# Patient Record
Sex: Female | Born: 1960 | Race: White | Hispanic: No | Marital: Married | Smoking: Never smoker
Health system: Southern US, Community
[De-identification: ages and names within clinical notes are randomized; demographics above are authoritative.]

## PROBLEM LIST (undated history)

## (undated) DIAGNOSIS — G40909 Epilepsy, unspecified, not intractable, without status epilepticus: Secondary | ICD-10-CM

## (undated) DIAGNOSIS — N83209 Unspecified ovarian cyst, unspecified side: Secondary | ICD-10-CM

## (undated) DIAGNOSIS — M8580 Other specified disorders of bone density and structure, unspecified site: Secondary | ICD-10-CM

## (undated) DIAGNOSIS — E042 Nontoxic multinodular goiter: Secondary | ICD-10-CM

## (undated) DIAGNOSIS — E785 Hyperlipidemia, unspecified: Secondary | ICD-10-CM

## (undated) HISTORY — DX: Epilepsy, unspecified, not intractable, without status epilepticus: G40.909

## (undated) HISTORY — DX: Hyperlipidemia, unspecified: E78.5

## (undated) HISTORY — DX: Unspecified ovarian cyst, unspecified side: N83.209

## (undated) HISTORY — DX: Nontoxic multinodular goiter: E04.2

## (undated) HISTORY — PX: ABDOMINAL SURGERY: SHX537

## (undated) HISTORY — DX: Other specified disorders of bone density and structure, unspecified site: M85.80

## (undated) HISTORY — PX: OVARIAN CYST SURGERY: SHX726

## (undated) HISTORY — PX: OTHER SURGICAL HISTORY: SHX169

---

## 1998-12-31 ENCOUNTER — Other Ambulatory Visit: Admission: RE | Admit: 1998-12-31 | Discharge: 1998-12-31 | Payer: Self-pay | Admitting: Obstetrics and Gynecology

## 2000-01-28 ENCOUNTER — Other Ambulatory Visit: Admission: RE | Admit: 2000-01-28 | Discharge: 2000-01-28 | Payer: Self-pay | Admitting: Obstetrics and Gynecology

## 2001-03-02 ENCOUNTER — Other Ambulatory Visit: Admission: RE | Admit: 2001-03-02 | Discharge: 2001-03-02 | Payer: Self-pay | Admitting: Obstetrics and Gynecology

## 2002-01-04 ENCOUNTER — Encounter: Admission: RE | Admit: 2002-01-04 | Discharge: 2002-01-04 | Payer: Self-pay | Admitting: Neurology

## 2002-01-04 ENCOUNTER — Encounter: Payer: Self-pay | Admitting: Neurology

## 2002-03-05 ENCOUNTER — Other Ambulatory Visit: Admission: RE | Admit: 2002-03-05 | Discharge: 2002-03-05 | Payer: Self-pay | Admitting: Obstetrics and Gynecology

## 2003-03-07 ENCOUNTER — Other Ambulatory Visit: Admission: RE | Admit: 2003-03-07 | Discharge: 2003-03-07 | Payer: Self-pay | Admitting: Obstetrics and Gynecology

## 2004-03-16 ENCOUNTER — Other Ambulatory Visit: Admission: RE | Admit: 2004-03-16 | Discharge: 2004-03-16 | Payer: Self-pay | Admitting: Obstetrics and Gynecology

## 2004-05-04 ENCOUNTER — Ambulatory Visit: Payer: Self-pay | Admitting: Internal Medicine

## 2005-03-11 ENCOUNTER — Ambulatory Visit: Payer: Self-pay | Admitting: Internal Medicine

## 2005-04-01 ENCOUNTER — Other Ambulatory Visit: Admission: RE | Admit: 2005-04-01 | Discharge: 2005-04-01 | Payer: Self-pay | Admitting: Obstetrics and Gynecology

## 2005-09-15 ENCOUNTER — Ambulatory Visit: Payer: Self-pay | Admitting: Internal Medicine

## 2006-02-23 ENCOUNTER — Ambulatory Visit: Payer: Self-pay | Admitting: Internal Medicine

## 2006-02-23 LAB — CONVERTED CEMR LAB
ALT: 37 units/L (ref 0–40)
AST: 25 units/L (ref 0–37)
Albumin: 3.9 g/dL (ref 3.5–5.2)
Alkaline Phosphatase: 88 units/L (ref 39–117)
BUN: 14 mg/dL (ref 6–23)
Basophils Absolute: 0 10*3/uL (ref 0.0–0.1)
Basophils Relative: 0.9 % (ref 0.0–1.0)
CO2: 32 meq/L (ref 19–32)
Calcium: 9.5 mg/dL (ref 8.4–10.5)
Chloride: 99 meq/L (ref 96–112)
Cholesterol: 254 mg/dL (ref 0–200)
Creatinine, Ser: 0.9 mg/dL (ref 0.4–1.2)
Direct LDL: 156.3 mg/dL
Eosinophils Absolute: 0.3 10*3/uL (ref 0.0–0.6)
Eosinophils Relative: 6.8 % — ABNORMAL HIGH (ref 0.0–5.0)
GFR calc Af Amer: 86 mL/min
GFR calc non Af Amer: 71 mL/min
Glucose, Bld: 77 mg/dL (ref 70–99)
HCT: 42.8 % (ref 36.0–46.0)
HDL: 61.4 mg/dL (ref >39.0)
Hemoglobin: 14.7 g/dL (ref 12.0–15.0)
Lymphocytes Relative: 38 % (ref 12.0–46.0)
MCHC: 34.2 g/dL (ref 30.0–36.0)
MCV: 94.6 fL (ref 78.0–100.0)
Monocytes Absolute: 0.3 10*3/uL (ref 0.2–0.7)
Monocytes Relative: 5.4 % (ref 3.0–11.0)
Neutro Abs: 2.3 10*3/uL (ref 1.4–7.7)
Neutrophils Relative %: 48.9 % (ref 43.0–77.0)
Phenytoin Lvl: 10.9 ug/mL (ref 10.0–20.0)
Platelets: 213 10*3/uL (ref 150–400)
Potassium: 4.4 meq/L (ref 3.5–5.1)
RBC: 4.52 M/uL (ref 3.87–5.11)
RDW: 12.4 % (ref 11.5–14.6)
Sodium: 139 meq/L (ref 135–145)
TSH: 1.53 microintl units/mL (ref 0.35–5.50)
Total Bilirubin: 0.8 mg/dL (ref 0.3–1.2)
Total CHOL/HDL Ratio: 4.1
Total Protein: 7.1 g/dL (ref 6.0–8.3)
Triglycerides: 189 mg/dL — ABNORMAL HIGH (ref 0–149)
VLDL: 38 mg/dL (ref 0–40)
WBC: 4.7 10*3/uL (ref 4.5–10.5)

## 2006-04-07 ENCOUNTER — Other Ambulatory Visit: Admission: RE | Admit: 2006-04-07 | Discharge: 2006-04-07 | Payer: Self-pay | Admitting: Obstetrics and Gynecology

## 2007-04-25 ENCOUNTER — Other Ambulatory Visit: Admission: RE | Admit: 2007-04-25 | Discharge: 2007-04-25 | Payer: Self-pay | Admitting: Obstetrics and Gynecology

## 2007-11-13 ENCOUNTER — Encounter: Payer: Self-pay | Admitting: Internal Medicine

## 2008-05-01 ENCOUNTER — Other Ambulatory Visit: Admission: RE | Admit: 2008-05-01 | Discharge: 2008-05-01 | Payer: Self-pay | Admitting: Obstetrics and Gynecology

## 2008-05-01 ENCOUNTER — Ambulatory Visit: Payer: Self-pay | Admitting: Obstetrics and Gynecology

## 2008-05-01 ENCOUNTER — Encounter: Payer: Self-pay | Admitting: Obstetrics and Gynecology

## 2008-06-07 ENCOUNTER — Ambulatory Visit: Payer: Self-pay | Admitting: Internal Medicine

## 2008-06-07 DIAGNOSIS — G40909 Epilepsy, unspecified, not intractable, without status epilepticus: Secondary | ICD-10-CM

## 2008-06-07 DIAGNOSIS — J309 Allergic rhinitis, unspecified: Secondary | ICD-10-CM | POA: Insufficient documentation

## 2008-09-04 ENCOUNTER — Ambulatory Visit: Payer: Self-pay | Admitting: Obstetrics and Gynecology

## 2009-05-11 ENCOUNTER — Other Ambulatory Visit: Admission: RE | Admit: 2009-05-11 | Discharge: 2009-05-11 | Payer: Self-pay | Admitting: Obstetrics and Gynecology

## 2009-05-11 ENCOUNTER — Ambulatory Visit: Payer: Self-pay | Admitting: Obstetrics and Gynecology

## 2009-05-29 ENCOUNTER — Encounter: Payer: Self-pay | Admitting: Internal Medicine

## 2009-08-11 ENCOUNTER — Encounter (INDEPENDENT_AMBULATORY_CARE_PROVIDER_SITE_OTHER): Payer: Self-pay | Admitting: *Deleted

## 2009-09-01 ENCOUNTER — Ambulatory Visit: Payer: Self-pay | Admitting: Internal Medicine

## 2009-09-01 DIAGNOSIS — E7800 Pure hypercholesterolemia, unspecified: Secondary | ICD-10-CM | POA: Insufficient documentation

## 2009-09-01 DIAGNOSIS — E785 Hyperlipidemia, unspecified: Secondary | ICD-10-CM | POA: Insufficient documentation

## 2009-09-01 LAB — CONVERTED CEMR LAB
Cholesterol, target level: 200 mg/dL
HDL goal, serum: 40 mg/dL
LDL Goal: 160 mg/dL

## 2009-09-03 ENCOUNTER — Encounter: Payer: Self-pay | Admitting: Internal Medicine

## 2009-09-07 ENCOUNTER — Encounter: Payer: Self-pay | Admitting: Internal Medicine

## 2009-09-07 LAB — CONVERTED CEMR LAB
ALT: 21 units/L (ref 0–35)
AST: 22 units/L (ref 0–37)
Albumin: 4.4 g/dL (ref 3.5–5.2)
Alkaline Phosphatase: 85 units/L (ref 39–117)
BUN: 15 mg/dL (ref 6–23)
Basophils Absolute: 0 10*3/uL (ref 0.0–0.1)
Basophils Relative: 0.5 % (ref 0.0–3.0)
Bilirubin, Direct: 0.1 mg/dL (ref 0.0–0.3)
CO2: 30 meq/L (ref 19–32)
Calcium: 10 mg/dL (ref 8.4–10.5)
Chloride: 108 meq/L (ref 96–112)
Cholesterol: 327 mg/dL — ABNORMAL HIGH (ref 0–200)
Creatinine, Ser: 1 mg/dL (ref 0.4–1.2)
Direct LDL: 212.2 mg/dL
Eosinophils Absolute: 0.2 10*3/uL (ref 0.0–0.7)
Eosinophils Relative: 4.2 % (ref 0.0–5.0)
GFR calc non Af Amer: 60.76 mL/min (ref >60)
Glucose, Bld: 86 mg/dL (ref 70–99)
HCT: 42.2 % (ref 36.0–46.0)
HDL: 63.6 mg/dL (ref >39.00)
Hemoglobin: 14.3 g/dL (ref 12.0–15.0)
Lymphocytes Relative: 39.8 % (ref 12.0–46.0)
Lymphs Abs: 2.2 10*3/uL (ref 0.7–4.0)
MCHC: 34 g/dL (ref 30.0–36.0)
MCV: 94.1 fL (ref 78.0–100.0)
Monocytes Absolute: 0.3 10*3/uL (ref 0.1–1.0)
Monocytes Relative: 5.5 % (ref 3.0–12.0)
Neutro Abs: 2.8 10*3/uL (ref 1.4–7.7)
Neutrophils Relative %: 50 % (ref 43.0–77.0)
Platelets: 200 10*3/uL (ref 150.0–400.0)
Potassium: 4.2 meq/L (ref 3.5–5.1)
RBC: 4.48 M/uL (ref 3.87–5.11)
RDW: 12.7 % (ref 11.5–14.6)
Sodium: 144 meq/L (ref 135–145)
TSH: 0.98 microintl units/mL (ref 0.35–5.50)
Total Bilirubin: 0.8 mg/dL (ref 0.3–1.2)
Total CHOL/HDL Ratio: 5
Total Protein: 7.6 g/dL (ref 6.0–8.3)
Triglycerides: 233 mg/dL — ABNORMAL HIGH (ref 0.0–149.0)
VLDL: 46.6 mg/dL — ABNORMAL HIGH (ref 0.0–40.0)
WBC: 5.6 10*3/uL (ref 4.5–10.5)

## 2009-12-11 ENCOUNTER — Encounter: Payer: Self-pay | Admitting: Internal Medicine

## 2010-01-31 HISTORY — PX: COLONOSCOPY: SHX174

## 2010-03-02 NOTE — Consult Note (Signed)
Summary: [facility]  [facility]   Imported By: Lanelle Bal 06/03/2009 10:18:41  _____________________________________________________________________  External Attachment:    Type:   Image     Comment:   External Document

## 2010-03-02 NOTE — Consult Note (Signed)
Summary: [facility]  [facility]   Imported By: Lanelle Bal 12/23/2009 14:13:40  _____________________________________________________________________  External Attachment:    Type:   Image     Comment:   External Document

## 2010-03-02 NOTE — Letter (Signed)
Summary: Letter with Path Results/[facility]  Letter with Path Results/[facility]   Imported By: Lanelle Bal 09/14/2009 12:26:06  _____________________________________________________________________  External Attachment:    Type:   Image     Comment:   External Document

## 2010-03-02 NOTE — Letter (Signed)
Summary: Previsit letter  [facility]  [address] [place], [place] [address]   Phone: [phone]  Fax: [phone]       08/11/2009 MRN: [id number]  Danielle Gamble [address] [place], [place]  [address]  Dear Ms. Dunkley,  Welcome to the Gastroenterology Division at [facility].    You are scheduled to see a nurse for your pre-procedure visit on 09-02-09 at 9:00a.m. on the 3rd floor at [facility], [address]  We ask that you try to arrive at our office 15 minutes prior to your appointment time to allow for check-in.  Your nurse visit will consist of discussing your medical and surgical history, your immediate family medical history, and your medications.    Please bring a complete list of all your medications or, if you prefer, bring the medication bottles and we will list them.  We will need to be aware of both prescribed and over the counter drugs.  We will need to know exact dosage information as well.  If you are on blood thinners (Coumadin, Plavix, Aggrenox, Ticlid, etc.) please call our office today/prior to your appointment, as we need to consult with your physician about holding your medication.   Please be prepared to read and sign documents such as consent forms, a financial agreement, and acknowledgement forms.  If necessary, and with your consent, a friend or relative is welcome to sit-in on the nurse visit with you.  Please bring your insurance card so that we may make a copy of it.  If your insurance requires a referral to see a specialist, please bring your referral form from your primary care physician.  No co-pay is required for this nurse visit.     If you cannot keep your appointment, please call [phone] to cancel or reschedule prior to your appointment date.  This allows [place] the opportunity to schedule an appointment for another patient in need of care.    Thank you for choosing [facility] for your medical needs.   We appreciate the opportunity to care for you.  Please visit [place] at our website  to learn more about our practice.                     Sincerely.                                                                                                                   The Gastroenterology Division

## 2010-03-02 NOTE — Letter (Signed)
Summary: Letter with Colonoscopy Results/[facility]  Letter with Colonoscopy Results/[facility]   Imported By: Lanelle Bal 09/15/2009 08:38:41  _____________________________________________________________________  External Attachment:    Type:   Image     Comment:   External Document

## 2010-03-02 NOTE — Assessment & Plan Note (Signed)
Summary: cpx & lab/cbs   Vital Signs:  Patient profile:   50 year old female Height:      69.5 inches Weight:      176.8 pounds BMI:     25.83 Temp:     98.7 degrees F oral Pulse rate:   76 / minute Resp:     14 per minute BP sitting:   102 / 60  (left arm) Cuff size:   large  Vitals Entered By: Shonna Chock CMA (September 01, 2009 9:58 AM)  CC: CPX with fasting labs , General Medical Evaluation, Lipid Management   CC:  CPX with fasting labs , General Medical Evaluation, and Lipid Management.  History of Present Illness: Danielle Gamble is here for a physical; she is asymptomatic.   Lipid Management History:      Negative NCEP/ATP III risk factors include female age less than 49 years old, no history of early menopause without estrogen hormone replacement, non-diabetic, HDL cholesterol greater than 60, no family history for ischemic heart disease, non-tobacco-user status, non-hypertensive, no ASHD (atherosclerotic heart disease), no prior stroke/TIA, no peripheral vascular disease, and no history of aortic aneurysm.     Preventive Screening-Counseling & Management  Alcohol-Tobacco     Smoking Status: never  Caffeine-Diet-Exercise     Does Patient Exercise: no  Current Medications (verified): 1)  Multivitamins  Tabs (Multiple Vitamin) .Marland Kitchen.. 1 By Mouth Once Daily 2)  Fish Oil 3000mg  .... 1 By Mouth Once Daily 3)  Calcium 1200mg  .... 1 By Mouth Once Daily 4)  Vit D .... 1 By Mouth Once Daily 5)  Aspirin 81 Mg Tabs (Aspirin) .Marland Kitchen.. 1 By Mouth Once Daily 6)  Lamictal Xr 50 Mg Xr24h-Tab (Lamotrigine) .Marland Kitchen.. 1 By Mouth Am , 1/2 By Mouth Pm  Allergies (verified): No Known Drug Allergies  Past History:  Past Medical History: Seizure disorder,  Lamtrigine  Rx, Dr Sandria Manly (previously  on Dilantin  1997-2010) Allergic rhinitis Hyperlipidemia: NMR 2007: LDL 181( 2231/1406), HDL 67, TG 122.LDL goal=< 110. [place] LDL goal = < 160.  Past Surgical History: G 0 P 0, Dr Eda Paschal; Ovarian  cystectomy X 2; Sternal cystectomy  Family History: Father: prostate cancer, MI in 53s, CBAG, CVA , pacer Mother: Alsheimer's  Siblings: negative P uncle :CAD  Social History: Occupation: Runner, broadcasting/film/video Married Never Smoked Alcohol use-yes: occasionally Regular exercise-no Does Patient Exercise:  no  Review of Systems  The patient denies anorexia, fever, vision loss, decreased hearing, hoarseness, chest pain, syncope, dyspnea on exertion, peripheral edema, prolonged cough, headaches, hemoptysis, abdominal pain, melena, hematochezia, severe indigestion/heartburn, hematuria, incontinence, suspicious skin lesions, depression, unusual weight change, abnormal bleeding, enlarged lymph nodes, and angioedema.         Weight up 10#; no specific diet.  Physical Exam  General:  well-nourished alert,appropriate and cooperative throughout examination Head:  Normocephalic and atraumatic without obvious abnormalities. Eyes:  No corneal or conjunctival inflammation noted.Perrla. Funduscopic exam benign, without hemorrhages, exudates or papilledema.  Ears:  External ear exam shows no significant lesions or deformities.  Otoscopic examination reveals clear canals, tympanic membranes are intact bilaterally without bulging, retraction, inflammation or discharge. Hearing is grossly normal bilaterally. Nose:  External nasal examination shows no deformity or inflammation. Nasal mucosa are pink and moist without lesions or exudates. Mouth:  Oral mucosa and oropharynx without lesions or exudates.  Teeth in good repair. Neck:  No deformities, masses, or tenderness noted. L thyroid > R w/o nodules Lungs:  Normal respiratory effort, chest expands symmetrically. Lungs are  clear to auscultation, no crackles or wheezes. Heart:  Normal rate and regular rhythm. S1 and S2 normal without gallop, murmur, click, rub . S4 Abdomen:  Bowel sounds positive,abdomen soft and non-tender without masses, organomegaly or hernias  noted. Genitalia:  Dr Eda Paschal Msk:  No deformity or scoliosis noted of thoracic or lumbar spine.   Pulses:  R and L carotid,radial,dorsalis pedis and posterior tibial pulses are full and equal bilaterally Extremities:  No clubbing, cyanosis, edema,   normal/ full range of motion of all joints.  Minor DIP OA changes Neurologic:  alert & oriented X3 and DTRs symmetrical and normal.   Skin:  Intact without suspicious lesions or rashes Cervical Nodes:  No lymphadenopathy noted Axillary Nodes:  No palpable lymphadenopathy Psych:  memory intact for recent and remote, normally interactive, and good eye contact.     Impression & Recommendations:  Problem # 1:  ROUTINE GENERAL MEDICAL EXAM@[email or web address]  CARE FACL (ICD-V70.0)  Orders: EKG w/ Interpretation ([address]) Venipuncture ([address]) TLB-Lipid Panel (80061-LIPID) TLB-BMP (Basic Metabolic Panel-BMET) (80048-METABOL) TLB-CBC Platelet - w/Differential (85025-CBCD) TLB-Hepatic/Liver Function Pnl (80076-HEPATIC) TLB-TSH (Thyroid Stimulating Hormone) (84443-TSH)  Problem # 2:  HYPERLIPIDEMIA (ICD-272.4) Minimal LDL goal = < 160, preferably < 110  Problem # 3:  SEIZURE DISORDER (ICD-780.39) as per Dr love The following medications were removed from the medication list:    Dilantin 100 Mg Caps (Phenytoin sodium extended) ..... 400mg  daily Her updated medication list for this problem includes:    Lamictal Xr 50 Mg Xr24h-tab (Lamotrigine) .Marland Kitchen... 1 by mouth am , 1/2 by mouth pm  Complete Medication List: 1)  Multivitamins Tabs (Multiple vitamin) .Marland Kitchen.. 1 by mouth once daily 2)  Fish Oil 3000mg   .... 1 by mouth once daily 3)  Calcium 1200mg   .... 1 by mouth once daily 4)  Vit D  .... 1 by mouth once daily 5)  Aspirin 81 Mg Tabs (Aspirin) .Marland Kitchen.. 1 by mouth once daily 6)  Lamictal Xr 50 Mg Xr24h-tab (Lamotrigine) .Marland Kitchen.. 1 by mouth am , 1/2 by mouth pm  Other Orders: Tdap => 71yrs IM (60454) Admin 1st Vaccine (09811)  Lipid Assessment/Plan:      Based on  NCEP/ATP III, the patient's risk factor category is "0-1 risk factors".  The patient's lipid goals are as follows: Total cholesterol goal is 200; LDL cholesterol goal is 160; HDL cholesterol goal is 40; Triglyceride goal is 150.    Patient Instructions: 1)  Review Dr Gildardo Griffes book Eat, Drink & Be Healthy for dietary cholesterol information.It is important that you exercise regularly at least 20 minutes 5 times a week. If you develop chest pain, have severe difficulty breathing, or feel very tired , stop exercising immediately and seek medical attention.Take an  81 mg coated Aspirin every day.   Immunizations Administered:  Tetanus Vaccine:    Vaccine Type: Tdap    Site: right deltoid    Mfr: GlaxoSmithKline    Dose: 0.5 ml    Route: IM    Given by: Shonna Chock CMA    Exp. Date: 04/25/2011    Lot #: [id number]    VIS given: 12/19/06 version given September 01, 2009.   Appended Document: cpx & lab/cbs

## 2010-05-13 ENCOUNTER — Other Ambulatory Visit: Payer: Self-pay | Admitting: Obstetrics and Gynecology

## 2010-05-13 ENCOUNTER — Encounter (INDEPENDENT_AMBULATORY_CARE_PROVIDER_SITE_OTHER): Payer: BC Managed Care – PPO | Admitting: Obstetrics and Gynecology

## 2010-05-13 ENCOUNTER — Other Ambulatory Visit (HOSPITAL_COMMUNITY)
Admission: RE | Admit: 2010-05-13 | Discharge: 2010-05-13 | Disposition: A | Discharge status: Home / Self Care | Payer: BC Managed Care – PPO | Source: Ambulatory Visit | Attending: Obstetrics and Gynecology | Admitting: Obstetrics and Gynecology

## 2010-05-13 DIAGNOSIS — Z01419 Encounter for gynecological examination (general) (routine) without abnormal findings: Secondary | ICD-10-CM

## 2010-05-13 DIAGNOSIS — R82998 Other abnormal findings in urine: Secondary | ICD-10-CM

## 2010-05-13 DIAGNOSIS — Z124 Encounter for screening for malignant neoplasm of cervix: Secondary | ICD-10-CM | POA: Insufficient documentation

## 2010-08-30 ENCOUNTER — Encounter: Payer: Self-pay | Admitting: Obstetrics and Gynecology

## 2010-09-10 ENCOUNTER — Encounter: Payer: Self-pay | Admitting: Internal Medicine

## 2010-09-10 ENCOUNTER — Ambulatory Visit (INDEPENDENT_AMBULATORY_CARE_PROVIDER_SITE_OTHER): Payer: BC Managed Care – PPO | Admitting: Internal Medicine

## 2010-09-10 VITALS — BP 124/80 | HR 87 | Temp 98.7°F | Resp 12 | Ht 69.5 in | Wt 180.8 lb

## 2010-09-10 DIAGNOSIS — M8580 Other specified disorders of bone density and structure, unspecified site: Secondary | ICD-10-CM | POA: Insufficient documentation

## 2010-09-10 DIAGNOSIS — T887XXA Unspecified adverse effect of drug or medicament, initial encounter: Secondary | ICD-10-CM

## 2010-09-10 DIAGNOSIS — E785 Hyperlipidemia, unspecified: Secondary | ICD-10-CM

## 2010-09-10 DIAGNOSIS — M949 Disorder of cartilage, unspecified: Secondary | ICD-10-CM

## 2010-09-10 DIAGNOSIS — M899 Disorder of bone, unspecified: Secondary | ICD-10-CM

## 2010-09-10 DIAGNOSIS — Z0000 Encounter for general adult medical examination without abnormal findings: Secondary | ICD-10-CM

## 2010-09-10 DIAGNOSIS — R569 Unspecified convulsions: Secondary | ICD-10-CM

## 2010-09-10 LAB — CBC WITH DIFFERENTIAL/PLATELET
Basophils Relative: 0.3 % (ref 0.0–3.0)
Eosinophils Relative: 3.6 % (ref 0.0–5.0)
HCT: 44.4 % (ref 36.0–46.0)
Hemoglobin: 14.9 g/dL (ref 12.0–15.0)
Lymphs Abs: 2.1 10*3/uL (ref 0.7–4.0)
Monocytes Relative: 5.2 % (ref 3.0–12.0)
Neutro Abs: 3.5 10*3/uL (ref 1.4–7.7)
RBC: 4.69 Mil/uL (ref 3.87–5.11)
WBC: 6.2 10*3/uL (ref 4.5–10.5)

## 2010-09-10 LAB — HEPATIC FUNCTION PANEL
ALT: 18 U/L (ref 0–35)
AST: 19 U/L (ref 0–37)
Bilirubin, Direct: 0 mg/dL (ref 0.0–0.3)
Total Bilirubin: 0.6 mg/dL (ref 0.3–1.2)

## 2010-09-10 LAB — BASIC METABOLIC PANEL
GFR: 72.71 mL/min (ref >60.00)
Glucose, Bld: 94 mg/dL (ref 70–99)
Potassium: 4.4 mEq/L (ref 3.5–5.1)
Sodium: 143 mEq/L (ref 135–145)

## 2010-09-10 LAB — LIPID PANEL
HDL: 66.3 mg/dL (ref >39.00)
Total CHOL/HDL Ratio: 4
VLDL: 33 mg/dL (ref 0.0–40.0)

## 2010-09-10 NOTE — Progress Notes (Signed)
  Subjective:    Patient ID: Danielle Gamble, female    DOB: December 11, 1960, 50 y.o.   MRN: [id number]  HPI  Danielle Gamble  is here for a physical; she has no acute issues.    Review of Systems Patient reports no vision/ hearing  changes, adenopathy,fever, weight change,  persistant / recurrent hoarseness , swallowing issues, chest pain,palpitations,edema,persistant /recurrent cough, hemoptysis, dyspnea( rest/ exertional/paroxysmal nocturnal), gastrointestinal bleeding(melena, rectal bleeding), abdominal pain, significant heartburn,  bowel changes,GU symptoms(dysuria, hematuria,pyuria, incontinence ), Gyn symptoms(abnormal  bleeding , pain),  syncope, focal weakness, memory loss,numbness & tingling, skin/hair /nail changes,abnormal bruising or bleeding, anxiety,or depression.      Objective:   Physical Exam Gen.: Healthy and well-nourished in appearance. Alert, appropriate and cooperative throughout exam. Head: Normocephalic without obvious abnormalities  Eyes: No corneal or conjunctival inflammation noted. Pupils equal round reactive to light and accommodation. Fundal exam is benign without hemorrhages, exudate, papilledema. Extraocular motion intact. Vision grossly normal  with lenses Ears: External  ear exam reveals no significant lesions or deformities. Canals clear .TMs normal. Hearing is grossly normal bilaterally. Nose: External nasal exam reveals no deformity or inflammation. Nasal mucosa are pink and moist. No lesions or exudates noted. Septum normal  Mouth: Oral mucosa and oropharynx reveal no lesions or exudates. Teeth in good repair. Neck: No deformities, masses, or tenderness noted. Range of motion normal. Thyroid :R lobe slightly small w/o nodules. Lungs: Normal respiratory effort; chest expands symmetrically. Lungs are clear to auscultation without rales, wheezes, or increased work of breathing. Heart: Normal rate and rhythm. Normal S1 and S2. No gallop, click, or rub. S4 w/o   murmur. Abdomen: Bowel sounds normal; abdomen soft and nontender. No masses, organomegaly or hernias noted. Genitalia: Dr Eda Paschal   .                                                                                   Musculoskeletal/extremities: No deformity or scoliosis noted of  the thoracic or lumbar spine. No clubbing, cyanosis, edema  noted. Range of motion  normal .Tone & strength  Normal.Joints:mild DIP  DJD changes. Nail health  good. Vascular: Carotid, radial artery, dorsalis pedis and  posterior tibial pulses are full and equal. No bruits present. Neurologic: Alert and oriented x3. Deep tendon reflexes symmetrical and normal.          Skin: Intact without suspicious lesions or rashes. Lymph: No cervical, axillary  lymphadenopathy present. Psych: Mood and affect are normal. Normally interactive                                                                                         Assessment & Plan:  #1 comprehensive physical exam; no acute findings #2 see Problem List with Assessments & Recommendations Plan: see Orders

## 2010-09-10 NOTE — Patient Instructions (Signed)
Preventive Health Care: Exercise  30-45  minutes a day, 3-4 days a week. Walking is especially valuable in preventing Osteoporosis. Eat a low-fat diet with lots of fruits and vegetables, up to 7-9 servings per day. Avoid obesity; your goal = waist less than 35 inches.Consume less than 30 grams of sugar per day from foods & drinks with High Fructose Corn Syrup as # 1,2,3 or #4 on label.

## 2010-09-16 ENCOUNTER — Encounter: Payer: Self-pay | Admitting: Internal Medicine

## 2010-09-30 ENCOUNTER — Encounter: Payer: Self-pay | Admitting: Internal Medicine

## 2011-05-09 ENCOUNTER — Encounter: Payer: Self-pay | Admitting: Gynecology

## 2011-05-09 DIAGNOSIS — N83209 Unspecified ovarian cyst, unspecified side: Secondary | ICD-10-CM | POA: Insufficient documentation

## 2011-05-17 ENCOUNTER — Encounter: Payer: BC Managed Care – PPO | Admitting: Obstetrics and Gynecology

## 2011-06-09 ENCOUNTER — Encounter: Payer: BC Managed Care – PPO | Admitting: Obstetrics and Gynecology

## 2011-06-21 ENCOUNTER — Encounter: Payer: BC Managed Care – PPO | Admitting: Obstetrics and Gynecology

## 2011-06-28 ENCOUNTER — Encounter: Payer: Self-pay | Admitting: Obstetrics and Gynecology

## 2011-06-28 ENCOUNTER — Ambulatory Visit (INDEPENDENT_AMBULATORY_CARE_PROVIDER_SITE_OTHER): Payer: BC Managed Care – PPO | Admitting: Obstetrics and Gynecology

## 2011-06-28 VITALS — BP 120/64 | Ht 69.0 in | Wt 182.0 lb

## 2011-06-28 DIAGNOSIS — Z01419 Encounter for gynecological examination (general) (routine) without abnormal findings: Secondary | ICD-10-CM

## 2011-06-28 NOTE — Progress Notes (Signed)
Patient came to see me today for her annual GYN exam. She is doing well without HRT. She is having no vaginal bleeding. She is having no pelvic pain. She will do her mammogram in July. Last July her bone density showed stable osteopenia without an elevated FRAX risk. She takes calcium and vitamin D. She's had no fractures. She does her lab through PCP. She has never had an abnormal Pap smear.  Physical examination:Danielle Gamble present. HEENT within normal limits. Neck: Thyroid not large. No masses. Supraclavicular nodes: not enlarged. Breasts: Examined in both sitting and lying  position. No skin changes and no masses. Abdomen: Soft no guarding rebound or masses or hernia. Pelvic: External: Within normal limits. BUS: Within normal limits. Vaginal:within normal limits. Good estrogen effect. No evidence of cystocele rectocele or enterocele. Cervix: clean. Uterus: Normal size and shape. Adnexa: No masses. Rectovaginal exam: Confirmatory and negative. Extremities: Within normal limits.  Assessment: Normal GYN exam. Stable osteopenia.  Plan: Continue yearly mammograms. Continue periodic bone densities.

## 2011-06-29 LAB — URINALYSIS W MICROSCOPIC + REFLEX CULTURE
Bilirubin Urine: NEGATIVE
Crystals: NONE SEEN
Glucose, UA: NEGATIVE mg/dL
Specific Gravity, Urine: 1.012 (ref 1.005–1.030)
Squamous Epithelial / LPF: NONE SEEN

## 2011-09-08 ENCOUNTER — Encounter: Payer: Self-pay | Admitting: Obstetrics and Gynecology

## 2011-11-15 ENCOUNTER — Ambulatory Visit (INDEPENDENT_AMBULATORY_CARE_PROVIDER_SITE_OTHER): Payer: BC Managed Care – PPO | Admitting: Internal Medicine

## 2011-11-15 ENCOUNTER — Encounter: Payer: Self-pay | Admitting: Internal Medicine

## 2011-11-15 ENCOUNTER — Ambulatory Visit
Admission: RE | Admit: 2011-11-15 | Discharge: 2011-11-15 | Disposition: A | Discharge status: Home / Self Care | Payer: BC Managed Care – PPO | Source: Ambulatory Visit | Attending: Internal Medicine | Admitting: Internal Medicine

## 2011-11-15 VITALS — BP 128/84 | HR 81 | Temp 98.2°F | Resp 14 | Ht 69.5 in | Wt 176.4 lb

## 2011-11-15 DIAGNOSIS — T887XXA Unspecified adverse effect of drug or medicament, initial encounter: Secondary | ICD-10-CM

## 2011-11-15 DIAGNOSIS — M8580 Other specified disorders of bone density and structure, unspecified site: Secondary | ICD-10-CM

## 2011-11-15 DIAGNOSIS — E785 Hyperlipidemia, unspecified: Secondary | ICD-10-CM

## 2011-11-15 DIAGNOSIS — Z0000 Encounter for general adult medical examination without abnormal findings: Secondary | ICD-10-CM

## 2011-11-15 DIAGNOSIS — M949 Disorder of cartilage, unspecified: Secondary | ICD-10-CM

## 2011-11-15 LAB — HEPATIC FUNCTION PANEL
ALT: 24 U/L (ref 0–35)
AST: 22 U/L (ref 0–37)
Bilirubin, Direct: 0.1 mg/dL (ref 0.0–0.3)
Total Bilirubin: 0.7 mg/dL (ref 0.3–1.2)

## 2011-11-15 LAB — TSH: TSH: 1.24 u[IU]/mL (ref 0.35–5.50)

## 2011-11-15 LAB — BASIC METABOLIC PANEL
BUN: 16 mg/dL (ref 6–23)
Creatinine, Ser: 0.8 mg/dL (ref 0.4–1.2)
GFR: 79.73 mL/min (ref >60.00)

## 2011-11-15 LAB — LDL CHOLESTEROL, DIRECT: Direct LDL: 220.1 mg/dL

## 2011-11-15 LAB — CBC WITH DIFFERENTIAL/PLATELET
Basophils Absolute: 0 10*3/uL (ref 0.0–0.1)
Eosinophils Relative: 4 % (ref 0.0–5.0)
Monocytes Relative: 5.8 % (ref 3.0–12.0)
Neutrophils Relative %: 48.1 % (ref 43.0–77.0)
Platelets: 215 10*3/uL (ref 150.0–400.0)
RDW: 12.8 % (ref 11.5–14.6)
WBC: 5.3 10*3/uL (ref 4.5–10.5)

## 2011-11-15 LAB — LIPID PANEL
Cholesterol: 289 mg/dL — ABNORMAL HIGH (ref 0–200)
Total CHOL/HDL Ratio: 5
Triglycerides: 173 mg/dL — ABNORMAL HIGH (ref 0.0–149.0)
VLDL: 34.6 mg/dL (ref 0.0–40.0)

## 2011-11-15 NOTE — Patient Instructions (Addendum)
Preventive Health Care: Exercise  30-45  minutes a day, 3-4 days a week. Walking is especially valuable in preventing Osteoporosis. Eat a low-fat diet with lots of fruits and vegetables, up to 7-9 servings per day. Consume less than 30 grams of sugar per day from foods & drinks with High Fructose Corn Syrup as #1,2,3 or #4 on label. Eye Doctor - have an eye exam @ least annually. Please take enteric-coated aspirin 81 mg daily with breakfast.  If you activate My Chart; the results can be released to you as soon as they populate from the lab. If you choose not to use this program; the labs have to be reviewed, copied & mailed   causing a delay in getting the results to you.

## 2011-11-15 NOTE — Progress Notes (Signed)
  Subjective:    Patient ID: Danielle Gamble, female    DOB: 1961/01/05, 51 y.o.   MRN: [id number]  HPI  Mircale is here for a physical; she denies acute health  issues .      Review of Systems  She elected not to take a statin, preferring to institute therapeutic life changes to control lipids. She is on modified low carb diet; she is not on an exercise program.  She denies chest pain, palpitations, exertional dyspnea, paroxysmal nocturnal dyspnea, claudication, or edema.  Her father had his first heart attack at 30; subsequently he had bypass & a pacer.       Objective:   Physical Exam Gen.: Healthy and well-nourished in appearance. Alert, appropriate and cooperative throughout exam. Head: Normocephalic without obvious abnormalities  Eyes: No corneal or conjunctival inflammation noted. Pupils equal round reactive to light and accommodation. Fundal exam is benign without hemorrhages, exudate, papilledema. Extraocular motion intact. Vision grossly normal with lenses. Ears: External  ear exam reveals no significant lesions or deformities. Canals clear .TMs normal. Hearing is grossly normal bilaterally. Nose: External nasal exam reveals no deformity or inflammation. Nasal mucosa are pink and moist. No lesions or exudates noted.  Mouth: Oral mucosa and oropharynx reveal no lesions or exudates. Teeth in good repair. Neck: No deformities, masses, or tenderness noted. Range of motion normal. Thyroid : ? Small nodule on R. Lungs: Normal respiratory effort; chest expands symmetrically. Lungs are clear to auscultation without rales, wheezes, or increased work of breathing. Heart: Normal rate and rhythm. Normal S1 and S2. No gallop, click, or rub. No murmur. Abdomen: Bowel sounds normal; abdomen soft and nontender. No masses, organomegaly or hernias noted. Genitalia:Dr  Gottsegen Musculoskeletal/extremities: Slight lordosis noted of  the thoracic  spine. No clubbing, cyanosis, edema noted. Range of  motion  normal .Tone & strength  Normal.Joints:mild DIP changes. Nail health  good. Vascular: Carotid, radial artery, dorsalis pedis and  posterior tibial pulses are full and equal. No bruits present. Neurologic: Alert and oriented x3. Deep tendon reflexes symmetrical and normal.          Skin: Intact without suspicious lesions or rashes. Lymph: No cervical, axillary lymphadenopathy present. Psych: Mood and affect are normal. Normally interactive                                                                                         Assessment & Plan:  #1 comprehensive physical exam #2 ? Thyroid nodule Plan: see Orders

## 2011-11-15 NOTE — Addendum Note (Signed)
Addended by: Silvio Pate D on: 11/15/2011 10:56 AM   Modules accepted: Orders

## 2011-11-16 ENCOUNTER — Telehealth: Payer: Self-pay

## 2011-11-16 NOTE — Telephone Encounter (Signed)
Pt called left message on triage line stating wanted to know [place] results. Called pt back stated to pt Dr. Sherren Mocha had a chance to result them yet will call when its done. Pt stated understanding.    MW

## 2011-11-17 ENCOUNTER — Telehealth: Payer: Self-pay

## 2011-11-17 ENCOUNTER — Encounter: Payer: Self-pay | Admitting: *Deleted

## 2011-11-17 ENCOUNTER — Telehealth: Payer: Self-pay | Admitting: Internal Medicine

## 2011-11-17 NOTE — Telephone Encounter (Signed)
Please advise 

## 2011-11-17 NOTE — Telephone Encounter (Signed)
Pt called concerning [place] results advised pt of the results:  Notes Recorded by Pecola Lawless, MD on 11/17/2011 at 7:41 AM Any "dominant" ( size > 1 cm ) thyroid nodule is usually biopsied under Xray guidance with a very fine needle. The alternative approach would be monitoring with Ultrasound & full thyroid function tests in 4-6 months or at least every 12 months. Please go to Web MD to review "Thyroid Nodule". After reviewing that site; please see me to discuss risk and options. Hopp    and let her know they had been mailed. Pt stated understanding.   MW

## 2011-11-17 NOTE — Telephone Encounter (Signed)
Caller: Mariacristina/Patient; Patient Name: Danielle Gamble; PCP: Marga Melnick; Best Callback Phone Number: [phone] Seen in office on Tuesday 11/15/2011 and sent for Thyroid UltraSound.  Got message from office to review Thyroid Nodule on Web MD and has done so.  Asking now if she needs to make appointment for Thyroid Biopsy.  EPIC says: Please go to Web MD to review "Thyroid Nodule". After reviewing that site; please see me to discuss risk and options.  Transferred to office for appointment wtih Dr Alwyn Ren to discuss information.

## 2011-11-17 NOTE — Telephone Encounter (Signed)
error 

## 2011-11-18 NOTE — Telephone Encounter (Signed)
The patient has a follow up on 10/29.     KP

## 2011-11-18 NOTE — Telephone Encounter (Signed)
rec to discuss w/ PCP next week, let pt know

## 2011-11-29 ENCOUNTER — Ambulatory Visit (INDEPENDENT_AMBULATORY_CARE_PROVIDER_SITE_OTHER): Payer: BC Managed Care – PPO | Admitting: Internal Medicine

## 2011-11-29 ENCOUNTER — Encounter: Payer: Self-pay | Admitting: Internal Medicine

## 2011-11-29 VITALS — BP 102/68 | HR 89 | Temp 98.6°F | Wt 178.4 lb

## 2011-11-29 DIAGNOSIS — E042 Nontoxic multinodular goiter: Secondary | ICD-10-CM

## 2011-11-29 NOTE — Progress Notes (Signed)
  Subjective:    Patient ID: Danielle Gamble, female    DOB: 03-27-1960, 52 y.o.   MRN: [id number]  HPI  Thyroid ultrasound report was reviewed; she has 3 nodules in the right lower lobe, the largest is 1.3 cm. The other 2 nodules are 1.1 and 1.0 cm in size. The dominant nodule does contain microcalcifications. There's also a 1.0 cm nodule in left lower lobe.  Her TSH has ranged from 0.98 to 1.53. The most recent value is 1.24.  There is no personal or family history of thyroid disease. There is no past history of facial or neck irradiation   Review of Systems She does have a history of ovarian cyst and fibroid of the sternum; she questioned whether these might be related to the thyroid nodules.  She had been experiencing some sensation of fullness in her neck in the submandibular areas     Objective:   Physical Exam   She appears healthy and well-nourished  Extraocular motion is intact without lid lag or proptosis  She has no lymphadenopathy about the neck or axilla. Specifically I feel no abnormalities in the carotid body area or submandibular area.  There is suggestion of a subtle nodule on the right on palpation the thyroid  There is no tremor or onycholysis present  Deep tendon reflexes are equal and normal         Assessment & Plan:

## 2011-11-29 NOTE — Assessment & Plan Note (Signed)
Risk and options discussed; she has elected to pursue the fine needle aspiration

## 2011-11-29 NOTE — Patient Instructions (Addendum)
Review and correct the record as indicated. Please share record with all medical staff seen.  

## 2011-12-08 ENCOUNTER — Other Ambulatory Visit (HOSPITAL_COMMUNITY)
Admission: RE | Admit: 2011-12-08 | Discharge: 2011-12-08 | Disposition: A | Discharge status: Home / Self Care | Payer: BC Managed Care – PPO | Source: Ambulatory Visit | Attending: Interventional Radiology | Admitting: Interventional Radiology

## 2011-12-08 ENCOUNTER — Ambulatory Visit
Admission: RE | Admit: 2011-12-08 | Discharge: 2011-12-08 | Disposition: A | Discharge status: Home / Self Care | Payer: BC Managed Care – PPO | Source: Ambulatory Visit | Attending: Internal Medicine | Admitting: Internal Medicine

## 2011-12-08 DIAGNOSIS — E042 Nontoxic multinodular goiter: Secondary | ICD-10-CM

## 2011-12-08 DIAGNOSIS — E049 Nontoxic goiter, unspecified: Secondary | ICD-10-CM | POA: Insufficient documentation

## 2011-12-15 ENCOUNTER — Telehealth: Payer: Self-pay

## 2011-12-15 NOTE — Telephone Encounter (Signed)
Message left on Triage voicemail: patient would like Thyroid Biopsy results  Hopp please advise

## 2011-12-16 ENCOUNTER — Telehealth: Payer: Self-pay

## 2011-12-16 ENCOUNTER — Other Ambulatory Visit: Payer: Self-pay | Admitting: Internal Medicine

## 2011-12-16 DIAGNOSIS — E041 Nontoxic single thyroid nodule: Secondary | ICD-10-CM

## 2011-12-16 NOTE — Telephone Encounter (Signed)
Pt called [facility] triage line concerning thyroid test results stating she received them in the mail and now is requesting an endo referral. Pt states she has seen Casimiro Needle Altheimer [address] In the past. And requesting to see him again. Pt asked should she call him and make appt or do we? Plz advise pt [phone] or [phone] MW

## 2011-12-16 NOTE — Telephone Encounter (Signed)
Patient called in indicating that she got results in the mail

## 2011-12-16 NOTE — Telephone Encounter (Signed)
Hopp please advise  

## 2011-12-20 NOTE — Telephone Encounter (Signed)
Spoke with patient, patient informed that referral placed, we are awaiting appointment. Patient states that if we do not hear with in the next few days she would like to see someone else for she is anxious to see someone

## 2011-12-20 NOTE — Telephone Encounter (Signed)
Pt [facility] stating third time calling concerning nodule on thyroid requesting to see endo MD Al[facility] need referral. Plz advise pt MW

## 2011-12-21 ENCOUNTER — Telehealth: Payer: Self-pay | Admitting: Internal Medicine

## 2011-12-21 DIAGNOSIS — M8580 Other specified disorders of bone density and structure, unspecified site: Secondary | ICD-10-CM

## 2011-12-21 NOTE — Telephone Encounter (Signed)
In reference to patient's call inquiring about why she hasn't heard anything about her bone density, I just looked, and there is no bone density order in epic.  Please enter.

## 2011-12-21 NOTE — Telephone Encounter (Signed)
Noted  

## 2011-12-21 NOTE — Telephone Encounter (Signed)
Erroneous phone note, patient not requesting BMD

## 2011-12-21 NOTE — Telephone Encounter (Signed)
On 12/16/11 the required referral form was completed, and along with it were faxed patient's clinicals, labs, tests, to A[facility]imer's office.  This is their process, once reviewed, they contact the patient to schedule her for an appointment.  I will call to check status of referral today and inform patient.

## 2011-12-26 ENCOUNTER — Ambulatory Visit: Payer: BC Managed Care – PPO | Admitting: Internal Medicine

## 2011-12-28 ENCOUNTER — Ambulatory Visit (INDEPENDENT_AMBULATORY_CARE_PROVIDER_SITE_OTHER): Payer: BC Managed Care – PPO | Admitting: Internal Medicine

## 2011-12-28 ENCOUNTER — Encounter: Payer: Self-pay | Admitting: Internal Medicine

## 2011-12-28 VITALS — BP 110/88 | HR 90 | Temp 98.2°F | Resp 16 | Wt 177.0 lb

## 2011-12-28 DIAGNOSIS — Z23 Encounter for immunization: Secondary | ICD-10-CM

## 2011-12-28 DIAGNOSIS — E042 Nontoxic multinodular goiter: Secondary | ICD-10-CM

## 2011-12-28 NOTE — Progress Notes (Signed)
Subjective:     Patient ID: Danielle Gamble, female   DOB: 24-Apr-1960, 51 y.o.   MRN: [id number]  HPI Danielle Gamble is a very pleasant 51 y/o WW referred by her PCP, Dr. Marga Melnick, for MNG, especially for discussion of the recent pathology report for the biopsy of her dominant thyroid nodule. She is here today with her husband.  Pt. described a nodule that she felt in her neck at her last Annual Physical Exam with Dr. Alwyn Ren. He also felt a right nodule, therefore, she was sent for a thyroid U/S, on 11/15/2011, that showed 4 nodules:  Bilateral thyroid nodules. The largest on the right is a 1.3 cm right lower pole nodule which is solid and contains microcalcifications. Adjacent 11 mm and 10 mm lower pole nodules which are solid. 10 mm solid nodule in the lower pole of the left thyroid lobe. Scattered subcentimeteric thyroid nodules. Lymphadenopathy: None visualized.  IMPRESSION:  Bilateral thyroid nodules as above. There is a 13 mm nodule in the lower pole of the right thyroid lobe containing microcalcifications. Given the microcalcifications, may consider tissue sampling of this nodule versus continued close interval follow-up.  She was then sent for FNA of the dominant right nodule, however, the sample was not considered adequate due to too little epithelium, obscuring blood and crush artifact. The tissue that was seen was considered benign.   Adequacy Reason Satisfactory But Limited For Evaluation Due to Scant Follicular Epithelium, Obscuring Blood and Crush Artifact. Diagnosis THYROID, FINE NEEDLE ASPIRATION, RIGHT: Scant follicular epithelium admixed with blood and scant colloid. Comment: in areas where the cells are well preserved and well visualized they appear benign and a hyperplastic nodule is favored. However, the obscuring blood, crush artifact and scant amount of follicular epithelium is limiting. Please correlate with clinical/radiologic impression. Dr. Luisa Hart has seen this case in  consultation with essential agreement. Danielle Abts MD Pathologist, [employer]  Pt is anxious about the result and is mainly here to discuss about it..   She denies h/o head/neck radiation or FH of thyroid cancer. Her last TSH was normal, 1.24 (11/15/2011). She still has some fullness in her neck, mainly in the right side, but no dysphagia.   Past Medical History  Diagnosis Date  . Seizure disorder     Dr Sandria Manly  . Allergic rhinitis     seasonal  . Hyperlipidemia   . Osteopenia 2003    T score -1.1 spine   . Ovarian cyst     PMH of  . Seizures     Past Surgical History  Procedure Date  . Ovarian cyst surgery     done in [place]  . Sternal cystectomy   . Colonoscopy 2012    negative, F/U in 2022([facility])  . Abdominal surgery     Expl. Laparotomy-Right ovar.cyst    History   Social History  . Marital Status: Married    Spouse Name: N/A    Number of Children: N/A  . Years of Education: N/A   Occupational History  . Not on file.   Social History Main Topics  . Smoking status: Never Smoker   . Smokeless tobacco: Not on file  . Alcohol Use: 0.5 oz/week    1 drink(s) per week     Comment: socially on weekends  . Drug Use: No  . Sexually Active: Yes    Birth Control/ Protection: Other-see comments     Comment: VASECTOMY   Other Topics Concern  . Not on file  Social History Narrative  . No narrative on file    Medication Sig  . aspirin 81 MG tablet Take 81 mg by mouth daily.    . Calcium Carbonate-Vit D-Min (CALCIUM 1200 PO) Take by mouth daily.    . Cholecalciferol (VITAMIN D3) 1000 UNITS CAPS Take by mouth daily.    . Garlic 1500 MG CAPS Take by mouth daily.  Marland Kitchen lamoTRIgine (LAMICTAL) 100 MG tablet Take 100 mg by mouth. 1 by mouth in the am, 1/2 by mouth in the evening   . Multiple Vitamin (MULTIVITAMIN) tablet Take 1 tablet by mouth daily.    . Omega-3 Fatty Acids (FISH OIL PO) Take 1,200 mg by mouth 2 (two) times daily.    No  Known Allergies  Family History  Problem Relation Age of Onset  . Prostate cancer Father   . Heart attack Father 68    CBAG; pacer  . Stroke Father 37  . Alzheimer's disease Mother   . Coronary artery disease Paternal Uncle   . Diabetes Neg Hx     Review of Systems Constitutional: no weight gain/loss, no fatigue, no subjective hyperthermia/hypothermia Eyes: no blurry vision, no xerophthalmia ENT: no sore throat, no nodules palpated in throat, no dysphagia/odynophagia, no hoarseness Cardiovascular: no CP/SOB/palpitations/leg swelling Respiratory: no cough/SOB Gastrointestinal: no N/V/D/C Musculoskeletal: no muscle/joint aches Skin: no rashes Neurological: no tremors/numbness/tingling/dizziness Psychiatric: no depression/anxiety     Objective:   Physical Exam BP 110/88  Pulse 90  Temp 98.2 F (36.8 C) (Oral)  Resp 16  Wt 177 lb (80.287 kg)  SpO2 96%    Wt Readings from Last 3 Encounters:  12/28/11 177 lb (80.287 kg)  11/29/11 178 lb 6.4 oz (80.922 kg)  11/15/11 176 lb 6.4 oz (80.015 kg)   Constitutional: in NAD Eyes: PERRLA, EOMI, no exophthalmos ENT: moist mucous membranes, no thyromegaly, nodule palpated in right antero-lateral neck, no cervical lymphadenopathy Cardiovascular: RRR, No MRG Respiratory: CTA B Gastrointestinal: abdomen soft, NT, ND, BS+ Musculoskeletal: no deformities, strength intact in all 4 Skin: moist, warm, no rashes Neurological: no tremor with outstretched hands, DTR normal in all 4  Assessment:     1. MNG - R lobe: 1.3 cm nodule with microcalcifications >> FNA nondiagnostic/unsatisfactory  1.1 cm nodule  1.0 cm nodule - L lobe: 1.0 cm nodule    Plan:     - we discussed about the pathology reading of her FNA and about the Bethesda cytopathologic classification of thyroid nodules. I gave her the reference article for it: E.S. Cibas and S.Z. Karie Mainland "The Bethesda System for Reporting Thyroid Cytopathology" Am J Clin Pathol  2009;132:658-665 - I explained that the Nondiagnostic/Unsatisfactory category confers a 1-4% chance of cancer, for her, probably at the lower end since she does not have other risk factors - I advised her to just monitor for growth for now, and let me know if this happens, or if she starts feeling neck compression spx - otherwise, I will see her in 1 year and reevaluate - since she was very anxious about the nature of the nodules, I suggested that she had a repeat FNA in a year, not only of the dominant nodule, but of all 4 nodules. If this is negative for malignancy, we can then follow the goiter on a more relaxed basis, repeating the U/S probably after another 3 years. - pt agrees to plan >> I placed the order for the FNA to be done before our next visit in a year so we can discuss  the results. - no labs for today

## 2011-12-28 NOTE — Patient Instructions (Signed)
Please return in a year to review the results of your ultrasound.

## 2012-01-05 ENCOUNTER — Other Ambulatory Visit: Payer: Self-pay | Admitting: Internal Medicine

## 2012-01-05 DIAGNOSIS — E042 Nontoxic multinodular goiter: Secondary | ICD-10-CM

## 2012-01-05 NOTE — Progress Notes (Signed)
Discussed with IR, they would like to have a regular thyroid U/S order to be done first (before FNA of all 4 nodules) in a year from now. Will order this.

## 2012-01-09 ENCOUNTER — Telehealth: Payer: Self-pay

## 2012-01-09 MED ORDER — SIMVASTATIN 20 MG PO TABS: 20.0000 mg | ORAL_TABLET | Freq: Every day | ORAL | Status: DC

## 2012-01-09 NOTE — Telephone Encounter (Signed)
Message left on voicemail: Patient is ready to consider taking Cholesterol medication, please send rx to [facility] at [address] and call patient when sent in   Dr.Hopper please advise

## 2012-01-09 NOTE — Telephone Encounter (Signed)
Discuss with patient, Rx sent. 

## 2012-01-09 NOTE — Telephone Encounter (Signed)
Simvastatin 20 mg at bedtime dispense 90.Please  schedule fasting Labs : CK,Lipids, hepatic panel after 10 weeks ( 272.4,995.20)

## 2012-01-19 ENCOUNTER — Ambulatory Visit (INDEPENDENT_AMBULATORY_CARE_PROVIDER_SITE_OTHER): Payer: BC Managed Care – PPO

## 2012-01-19 DIAGNOSIS — Z0000 Encounter for general adult medical examination without abnormal findings: Secondary | ICD-10-CM

## 2012-01-19 DIAGNOSIS — Z2911 Encounter for prophylactic immunotherapy for respiratory syncytial virus (RSV): Secondary | ICD-10-CM

## 2012-04-13 ENCOUNTER — Other Ambulatory Visit: Payer: Self-pay | Admitting: Internal Medicine

## 2012-07-03 ENCOUNTER — Encounter: Payer: Self-pay | Admitting: Gynecology

## 2012-07-03 ENCOUNTER — Ambulatory Visit (INDEPENDENT_AMBULATORY_CARE_PROVIDER_SITE_OTHER): Payer: BC Managed Care – PPO | Admitting: Gynecology

## 2012-07-03 VITALS — BP 108/70 | Ht 68.25 in | Wt 179.0 lb

## 2012-07-03 DIAGNOSIS — M8580 Other specified disorders of bone density and structure, unspecified site: Secondary | ICD-10-CM

## 2012-07-03 DIAGNOSIS — Z01419 Encounter for gynecological examination (general) (routine) without abnormal findings: Secondary | ICD-10-CM

## 2012-07-03 DIAGNOSIS — N952 Postmenopausal atrophic vaginitis: Secondary | ICD-10-CM

## 2012-07-03 DIAGNOSIS — M899 Disorder of bone, unspecified: Secondary | ICD-10-CM

## 2012-07-03 DIAGNOSIS — M949 Disorder of cartilage, unspecified: Secondary | ICD-10-CM

## 2012-07-03 NOTE — Patient Instructions (Signed)
Followup with bone density and mammogram this coming summer. Followup in 1 year for annual exam.

## 2012-07-03 NOTE — Progress Notes (Signed)
Danielle Gamble [birth year] [id number]        52 y.o.  G0P0 for annual exam.  Former patient of Dr. Eda Paschal doing well without complaints.  Past medical history,surgical history, medications, allergies, family history and social history were all reviewed and documented in the EPIC chart.  ROS:  Performed and pertinent positives and negatives are included in the history, assessment and plan .  Exam: Biomedical scientist Filed Vitals:   07/03/12 1557  BP: 108/70  Height: 5' 8.25" (1.734 m)  Weight: 179 lb (81.194 kg)   General appearance  Normal Skin grossly normal Head/Neck normal with no cervical or supraclavicular adenopathy thyroid normal Lungs  clear Cardiac RR, without RMG Abdominal  soft, nontender, without masses, organomegaly or hernia Breasts  examined lying and sitting without masses, retractions, discharge or axillary adenopathy. Pelvic  Ext/BUS/vagina  normal with mild atrophic changes  Cervix  normal with mild atrophic changes  Uterus  anteverted, normal size, shape and contour, midline and mobile nontender   Adnexa  Without masses or tenderness    Anus and perineum  normal   Rectovaginal  normal sphincter tone without palpated masses or tenderness.    Assessment/Plan:  52 y.o. G0P0 female for annual exam.   1. Postmenopausal. Doing well without significant symptoms such as hot flashes night sweats vaginal dryness dyspareunia. Never took HRT. Plan expectant management at her choice. Patient knows to report any bleeding. 2. Osteopenia. DEXA 08/2010 with T score -1.7. FRAX 5.7%/0.6%. Increase calcium vitamin D reviewed. In checking vitamin D level next time Dr. Alwyn Ren does blood work. Recommend repeat DEXA this summer at a 2 year interval at [facility] where she has it done. 3. Mammography 09/2011. Continue with annual mammography. SBE monthly reviewed. 4. Pap smear 2012. No Pap smear done today. No history of abnormal Pap smears previously. Plan repeat next year at 3 year  interval. 5. Colonoscopy 2013. Repeat at their recommended interval. 6. Health maintenance. Patient sees Dr. Alwyn Ren routinely. The blood work done today since done through his office. Followup in one year, sooner as needed.    [name], 4:50 PM 07/03/2012

## 2012-07-04 ENCOUNTER — Encounter: Payer: Self-pay | Admitting: Obstetrics and Gynecology

## 2012-07-10 ENCOUNTER — Encounter: Payer: Self-pay | Admitting: Obstetrics and Gynecology

## 2012-09-10 ENCOUNTER — Encounter: Payer: Self-pay | Admitting: Internal Medicine

## 2012-09-10 ENCOUNTER — Ambulatory Visit (INDEPENDENT_AMBULATORY_CARE_PROVIDER_SITE_OTHER): Payer: BC Managed Care – PPO | Admitting: Internal Medicine

## 2012-09-10 ENCOUNTER — Ambulatory Visit: Payer: BC Managed Care – PPO | Admitting: Internal Medicine

## 2012-09-10 VITALS — BP 118/76 | HR 80 | Wt 176.0 lb

## 2012-09-10 DIAGNOSIS — B070 Plantar wart: Secondary | ICD-10-CM

## 2012-09-10 DIAGNOSIS — E042 Nontoxic multinodular goiter: Secondary | ICD-10-CM

## 2012-09-10 NOTE — Progress Notes (Signed)
  Subjective:    Patient ID: Danielle Gamble, female    DOB: 1960/07/15, 52 y.o.   MRN: [id number]  HPI   She's had a lesion over the ventral surface of her right foot for 6-8 months. She's noted intermittent redness along its lateral surface and also intermittent throbbing in this area when supine. It is more symptomatic on hard surfaces. She's noticed it is affected her stance; she tends to invert the foot.      Review of Systems  There has been no rash; she describes dryness of her feet and is tender to wear socks to increase moisture.  She denies any constitutional symptoms such as fever, chills, sweats, or weight loss.  No numbness & tingling in foot  She is seen a dermatologist on 2 occasions for the great toenail changes. She was told it is not a fungus infection based on scrapings. TSH & FBS WNL  S/P biopsies of neck "cysts" (thyroid biopsies ; monitored by Dr Wyonia Hough. That note was reviewed)     Objective:   Physical Exam  She is healthy and well-nourished in no distress  Slightly asymmetric thyroid with irregular texture  Pedal pulses are intact and equal.  She does have the IP degenerative joint changes in hands  She has onycholysis of the right great toenail  There is  a 2 x 2.5 cm thickened area over the right sole at the base of the toes. Within this are  there is a 5 x 3 mm warty growth @ 7 o'clock. There is no evidence of active cellulitis.  DTRs WNL          Assessment & Plan:  #1 plantar wart versus neuroma #2 onycholysis, R/O fungal infection #3 OA DIP changes #4 multinodular goiter  Plan: Podiatry consult Endocrine F/U 11/14

## 2012-09-10 NOTE — Patient Instructions (Addendum)
You can use the MyChart system to gain direct  access to your records  if  out of town or @ an office of a  physician who is not in  the My Chart network.  This improves continuity of care & places you in control of your medical record.

## 2012-09-11 ENCOUNTER — Encounter: Payer: Self-pay | Admitting: Gynecology

## 2012-09-12 ENCOUNTER — Ambulatory Visit (INDEPENDENT_AMBULATORY_CARE_PROVIDER_SITE_OTHER): Payer: BC Managed Care – PPO | Admitting: Podiatry

## 2012-09-12 DIAGNOSIS — L84 Corns and callosities: Secondary | ICD-10-CM | POA: Insufficient documentation

## 2012-09-12 DIAGNOSIS — M21969 Unspecified acquired deformity of unspecified lower leg: Secondary | ICD-10-CM

## 2012-09-12 DIAGNOSIS — B351 Tinea unguium: Secondary | ICD-10-CM

## 2012-09-12 DIAGNOSIS — R234 Changes in skin texture: Secondary | ICD-10-CM | POA: Insufficient documentation

## 2012-09-12 DIAGNOSIS — L988 Other specified disorders of the skin and subcutaneous tissue: Secondary | ICD-10-CM

## 2012-09-12 DIAGNOSIS — Q828 Other specified congenital malformations of skin: Secondary | ICD-10-CM

## 2012-09-12 MED ORDER — EFINACONAZOLE 10 % EX SOLN: 1.0000 | Freq: Every morning | CUTANEOUS | Status: DC

## 2012-09-12 NOTE — Progress Notes (Signed)
Subjective: 52 year old female presents complaining of painful callus under ball right that is more painful than others. Big toe nail fungus x 12-14 months. Also has painful cracking callus on both heels. Patient was referred by Dr. Alwyn Ren.  Review of Systems - General ROS: negative for - chills, fatigue, hot flashes, night sweats, sleep disturbance, weight gain or weight loss Psychological ROS: negative Ophthalmic ROS: Bifocal glasses. ENT ROS: negative Allergy and Immunology ROS: Seasonal hayfever. Hematological and Lymphatic ROS: negative Breast ROS: negative for breast lumps Respiratory ROS: no cough, shortness of breath, or wheezing Cardiovascular ROS: no chest pain or dyspnea on exertion Gastrointestinal ROS: no abdominal pain, change in bowel habits, or black or bloody stools Musculoskeletal ROS: negative Neurological ROS: no TIA or stroke symptoms Dermatological ROS: Dry scaly skin.  Objective: Vascular:  All pedal pulses are palpable. No edema or erythema noted. Dermatologic:  Mild yellow spotty discolored with mild thickness on both great toe nails. Thick plantar keratosis with round demarcation just proximal to the 3rd MPJ 3 right. Broad callus under 5th MPJ bilateral. Thick cracked callus both plantar posterior heel. Neurologic:  No subjective numbness or tingling sensations. All epicritic and tactile sensations grossly intact. Orthopedic:  Excess sagittal plane motion at the first MCJ and lateral weight shifting upon loading of forefoot bilateral.  Prominent 5th MPJ bilateral with plantar callus.  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 Plantar callus sub 5 bilateral. Elevated first ray bilateral. Mild case of Onychomycosis both great toe nails. Fissured heel calluses bilateral.   Plan:  Rx. Jublia for fungal nails. Both feet soaked and debrided all calluses, and fissured and cracked heel.  May benefit from Orthotic shoe inserts to accommodate sub 3 right  foot lesion. Return for palliation prn.

## 2012-10-13 ENCOUNTER — Other Ambulatory Visit: Payer: Self-pay | Admitting: Internal Medicine

## 2012-10-15 NOTE — Telephone Encounter (Signed)
Med filled #30 pt needs labs.

## 2012-11-05 ENCOUNTER — Other Ambulatory Visit: Payer: Self-pay | Admitting: *Deleted

## 2012-11-05 DIAGNOSIS — E785 Hyperlipidemia, unspecified: Secondary | ICD-10-CM

## 2012-11-07 ENCOUNTER — Other Ambulatory Visit: Payer: BC Managed Care – PPO

## 2012-11-08 ENCOUNTER — Other Ambulatory Visit: Payer: BC Managed Care – PPO

## 2012-11-13 ENCOUNTER — Other Ambulatory Visit (INDEPENDENT_AMBULATORY_CARE_PROVIDER_SITE_OTHER): Payer: BC Managed Care – PPO

## 2012-11-13 DIAGNOSIS — E785 Hyperlipidemia, unspecified: Secondary | ICD-10-CM

## 2012-11-14 LAB — HEPATIC FUNCTION PANEL
ALT: 25 U/L (ref 0–35)
AST: 28 U/L (ref 0–37)
Albumin: 4.4 g/dL (ref 3.5–5.2)
Alkaline Phosphatase: 87 U/L (ref 39–117)
Bilirubin, Direct: 0 mg/dL (ref 0.0–0.3)
Total Bilirubin: 0.4 mg/dL (ref 0.3–1.2)
Total Protein: 7.6 g/dL (ref 6.0–8.3)

## 2012-11-14 LAB — LDL CHOLESTEROL, DIRECT: Direct LDL: 111.9 mg/dL

## 2012-11-14 LAB — LIPID PANEL
Cholesterol: 210 mg/dL — ABNORMAL HIGH (ref 0–200)
Triglycerides: 325 mg/dL — ABNORMAL HIGH (ref 0.0–149.0)

## 2012-11-15 ENCOUNTER — Encounter: Payer: Self-pay | Admitting: *Deleted

## 2012-11-15 NOTE — Progress Notes (Signed)
Letter mailed to patient.

## 2012-11-19 ENCOUNTER — Telehealth: Payer: Self-pay | Admitting: Internal Medicine

## 2012-11-19 MED ORDER — SIMVASTATIN 20 MG PO TABS: ORAL_TABLET | ORAL | Status: DC

## 2012-11-19 NOTE — Telephone Encounter (Signed)
Simvastatin refilled #14, 0 refills

## 2012-11-19 NOTE — Telephone Encounter (Signed)
Patient called to refill her simvastatin (ZOCOR) 20 MG tablet temporary until her apt on October 28th @4pm      Pharmacy Pharmacy: [id number] - [place], [place] - [address]

## 2012-11-27 ENCOUNTER — Encounter: Payer: Self-pay | Admitting: Internal Medicine

## 2012-11-27 ENCOUNTER — Ambulatory Visit (INDEPENDENT_AMBULATORY_CARE_PROVIDER_SITE_OTHER): Payer: BC Managed Care – PPO | Admitting: Internal Medicine

## 2012-11-27 VITALS — BP 107/75 | HR 97 | Temp 99.6°F | Wt 175.2 lb

## 2012-11-27 DIAGNOSIS — Z23 Encounter for immunization: Secondary | ICD-10-CM

## 2012-11-27 DIAGNOSIS — E785 Hyperlipidemia, unspecified: Secondary | ICD-10-CM

## 2012-11-27 MED ORDER — SIMVASTATIN 20 MG PO TABS: ORAL_TABLET | ORAL | Status: DC

## 2012-11-27 NOTE — Progress Notes (Signed)
  Subjective:    Patient ID: Danielle Gamble, female    DOB: [birth year], 52 y.o.   MRN: [id number]  HPI  She returns for assessment of response to low-dose simvastatin. Her LDL has dropped from 220.1-111.9 on 20 mg of simvastatin. This represents almost 120% reduction in long-term risk.  Liver functions are perfectly normal  Unfortunately in the interim her triglycerides have risen from 173-325. These have been as low as 122.    Review of Systems A heart healthy diet is followed; exercise encompasses 30 minutes 3-4 times per week as swimming  without symptoms. Specifically denied are  chest pain, palpitations, dyspnea, or claudication.  Family history is negative  for premature coronary disease. Advanced cholesterol testing reveals  LDL goal is less than 110 ; ideally <80  . There is medication compliance with the statin. Significant abdominal symptoms, memory deficit, or myalgias denied.     Objective:   Physical Exam Appears healthy and well-nourished & in no acute distress  No carotid bruits are present.No neck pain distention present at 10 - 15 degrees. Thyroid irregular in contour & asymmetric to palpation  Heart rhythm and rate are normal with no significant murmurs or gallops. S4  Chest is clear with no increased work of breathing  There is no evidence of aortic aneurysm or renal artery bruits  Abdomen soft with no organomegaly or masses. No HJR  No clubbing, cyanosis or edema present.  Pedal pulses are intact   No ischemic skin changes are present . Nails healthy   Alert and oriented. Strength, tone, DTRs reflexes normal          Assessment & Plan:  See Current Assessment & Plan in Problem List under specific Diagnosis

## 2012-11-27 NOTE — Patient Instructions (Signed)
Please  schedule fasting Lipids in 12 months

## 2012-11-27 NOTE — Assessment & Plan Note (Signed)
Nutritional intervention stressed; no change in medication

## 2012-12-04 ENCOUNTER — Encounter: Payer: Self-pay | Admitting: Internal Medicine

## 2012-12-04 ENCOUNTER — Ambulatory Visit (INDEPENDENT_AMBULATORY_CARE_PROVIDER_SITE_OTHER): Payer: BC Managed Care – PPO | Admitting: Internal Medicine

## 2012-12-04 VITALS — BP 102/68 | HR 86 | Temp 98.9°F | Resp 10 | Wt 175.5 lb

## 2012-12-04 DIAGNOSIS — E042 Nontoxic multinodular goiter: Secondary | ICD-10-CM

## 2012-12-04 NOTE — Patient Instructions (Signed)
Please return in 1 year.  I will order the thyroid biopsy for all the 4 nodules. Please expect a call from [facility].

## 2012-12-04 NOTE — Progress Notes (Signed)
Subjective:     Patient ID: Danielle Gamble, female   DOB: 1960/10/05, 52 y.o.   MRN: [id number]  HPI Danielle Gamble is a pleasant 52 y.o. woman initially referred by Dr. Marga Melnick, for MNG, now returning for followup. Last visit a year ago. She is here today with her husband.  Reviewed history: Pt. Found a right thyroid nodule, later also palpated by PCP during her Annual Physical Exam in 2013. A thyroid U/S, on 11/15/2011 showed MNG, with bilateral subcm nodules, but also 4 larger nodules: largest = 1.3 cm right lower pole nodule, solid, containing microcalcifications. Adjacent 11 mm and 10 mm lower pole nodules, also solid. Also, there was a 10 mm solid nodule. She was then sent for FNA of the dominant right nodule, however, the sample was not considered adequate due to too little epithelium, obscuring blood and crush artifact. The tissue that was seen was considered benign. Patient was referred to me for further discussion since she was very anxious about the result. I suggested to wait another year and re-biopsy the nodule.  She returns now, a year after last visit. No neck compression sxs. No signs or sxs of hypo or hyperthyroidism.  Last TSH was normal: Lab Results  Component Value Date   TSH 1.24 11/15/2011   Review of Systems Constitutional: no weight gain/loss, no fatigue, no subjective hyperthermia/hypothermia Eyes: no blurry vision, no xerophthalmia ENT: no sore throat, no nodules palpated in throat, no dysphagia/odynophagia, no hoarseness Cardiovascular: no CP/SOB/palpitations/leg swelling Respiratory: no cough/SOB Gastrointestinal: no N/V/D/C Musculoskeletal: no muscle/joint aches Skin: no rashes Neurological: no tremors/numbness/tingling/dizziness Psychiatric: no depression/anxiety  I reviewed pt's medications, allergies, PMH, social hx, family hx and no changes required, except as mentioned above, and she started simvastatin 20 mg daily.  Objective:   Physical Exam BP  102/68  Pulse 86  Temp(Src) 98.9 F (37.2 C) (Oral)  Resp 10  Wt 175 lb 8 oz (79.606 kg)  SpO2 96%    Wt Readings from Last 3 Encounters:  12/04/12 175 lb 8 oz (79.606 kg)  11/27/12 175 lb 3.2 oz (79.47 kg)  09/10/12 176 lb (79.833 kg)   Constitutional: in NAD Eyes: PERRLA, EOMI, no exophthalmos ENT: moist mucous membranes, no thyromegaly, nodule palpated in right antero-lateral neck, no cervical lymphadenopathy Cardiovascular: RRR, No MRG Respiratory: CTA B Gastrointestinal: abdomen soft, NT, ND, BS+ Musculoskeletal: no deformities, strength intact in all 4 Skin: moist, warm, no rashes Neurological: no tremor with outstretched hands, DTR normal in all 4  Assessment:     1. MNG - R lobe: 1.3 cm nodule with microcalcifications >> FNA nondiagnostic/unsatisfactory  1.1 cm nodule  1.0 cm nodule - L lobe: 1.0 cm nodule  - FNA R 1.3 cm nodule: Adequacy Reason Satisfactory But Limited For Evaluation Due to Scant Follicular Epithelium, Obscuring Blood and Crush Artifact. Diagnosis THYROID, FINE NEEDLE ASPIRATION, RIGHT: Scant follicular epithelium admixed with blood and scant colloid. Comment: in areas where the cells are well preserved and well visualized they appear benign and a hyperplastic nodule is favored. However, the obscuring blood, crush artifact and scant amount of follicular epithelium is limiting.   Plan:     At last visit, we discussed about the pathology reading of her FNA and about the Bethesda cytopathologic classification of thyroid nodules. I explained that the Nondiagnostic/Unsatisfactory category confers a 1-4% chance of cancer, for her, probably at the lower end since she does not have other risk factors. I suggested that we met in 1 year and  reevaluate. - she has no new neck compression symptoms, or any indication that the nodules have grown in the last year - since she is very anxious about the nature of the nodules, I suggested that she had a repeat FNA in  a year, not only of the dominant nodule, but of all 4 nodules. If this is negative for malignancy, we can then follow the goiter on a more relaxed basis, repeating the U/S probably after another 3 years. - pt agrees to plan >> I placed the order for the FNA to be done in [facility]. - since her last set of thyroid labs were a year ago, I will repeat them today - I will see the patient back in a year; however, if biopsy is positive for malignancy, I would see her sooner.   12/11/2012: THYROID ULTRASOUND COMPARISON: 11/15/2011  FINDINGS: - Right thyroid lobe: 5.6 x 1.9 x 2.0 cm. Similar pattern of right mid to lower pole thyroid nodules. These nodules are unchanged in size or sonographic appearance. Dominant nodule in the right inferior thyroid gland measures 1.4 x 1.0 x 1.0 cm. All of the other right thyroid nodules measure 1 cm or less in size. No new or enlarging suspicious thyroid nodule. No lesion meets criteria for repeat biopsy. - Left thyroid lobe: 4.7 x 1.3 x 1.6 cm. Stable 9 mm left inferior pole solid thyroid nodule - Isthmus Thickness: 2 mm. No nodules visualized. - Lymphadenopathy Nonvisualized  IMPRESSION: Stable bilateral thyroid nodules.  Will see if pt agrees to wait another 2 years and repeat U/S and see from there. I called but was only able to leave a message.

## 2012-12-11 ENCOUNTER — Ambulatory Visit
Admission: RE | Admit: 2012-12-11 | Discharge: 2012-12-11 | Disposition: A | Discharge status: Home / Self Care | Payer: BC Managed Care – PPO | Source: Ambulatory Visit | Attending: Internal Medicine | Admitting: Internal Medicine

## 2012-12-11 ENCOUNTER — Ambulatory Visit: Admission: RE | Admit: 2012-12-11 | Payer: BC Managed Care – PPO | Source: Ambulatory Visit

## 2012-12-11 DIAGNOSIS — E042 Nontoxic multinodular goiter: Secondary | ICD-10-CM

## 2013-01-31 HISTORY — PX: BIOPSY THYROID: PRO38

## 2013-02-17 ENCOUNTER — Other Ambulatory Visit: Payer: Self-pay

## 2013-02-17 DIAGNOSIS — R569 Unspecified convulsions: Secondary | ICD-10-CM

## 2013-02-17 MED ORDER — LAMOTRIGINE 100 MG PO TABS: ORAL_TABLET | ORAL | Status: DC

## 2013-02-17 NOTE — Telephone Encounter (Signed)
Former [facility] patient assigned to Dr Marjory LiesPenumalli per Dr Imagene GurneyLove's nptes in [facility] .

## 2013-02-22 ENCOUNTER — Encounter: Payer: BC Managed Care – PPO | Admitting: Internal Medicine

## 2013-02-25 ENCOUNTER — Telehealth: Payer: Self-pay | Admitting: Neurology

## 2013-02-25 NOTE — Telephone Encounter (Signed)
It is OK to schedule her at 3:30pm, My latest slot.

## 2013-02-26 NOTE — Telephone Encounter (Signed)
Called to sched , lt VM message for appt

## 2013-03-28 ENCOUNTER — Other Ambulatory Visit: Payer: Self-pay

## 2013-03-28 DIAGNOSIS — R569 Unspecified convulsions: Secondary | ICD-10-CM

## 2013-03-28 MED ORDER — LAMOTRIGINE 100 MG PO TABS: ORAL_TABLET | ORAL | Status: DC

## 2013-04-16 ENCOUNTER — Encounter (INDEPENDENT_AMBULATORY_CARE_PROVIDER_SITE_OTHER): Payer: Self-pay

## 2013-04-16 ENCOUNTER — Encounter: Payer: Self-pay | Admitting: Neurology

## 2013-04-16 ENCOUNTER — Ambulatory Visit (INDEPENDENT_AMBULATORY_CARE_PROVIDER_SITE_OTHER): Payer: BC Managed Care – PPO | Admitting: Neurology

## 2013-04-16 VITALS — BP 127/82 | HR 94 | Ht 68.75 in | Wt 173.0 lb

## 2013-04-16 DIAGNOSIS — R569 Unspecified convulsions: Secondary | ICD-10-CM

## 2013-04-16 MED ORDER — LAMOTRIGINE 100 MG PO TABS: ORAL_TABLET | ORAL | Status: DC

## 2013-04-16 NOTE — Progress Notes (Signed)
PATIENT: Danielle LemonsRoberta R Gamble DOB: 1960/05/05  HISTORICAL  Danielle Gamble is a 53 years old right-handed Caucasian female, previously patient of Dr. 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 aspirin 81 MG tablet Take 81 mg by mouth daily.        . Calcium Carbonate-Vit D-Min (CALCIUM 1200 PO) Take by mouth daily.        . Cholecalciferol (VITAMIN D3) 1000 UNITS CAPS Take by mouth daily.        . Efinaconazole (JUBLIA) 10 % SOLN Apply 1 Bottle topically every morning.  1 Bottle  6  . Multiple Vitamin (MULTIVITAMIN) tablet Take 1 tablet by mouth daily.        . Omega-3 Fatty Acids (FISH OIL PO) Take 1,200 mg by mouth daily.       . simvastatin (ZOCOR) 20 MG tablet Take one tablet by mouth at bedtime  90 tablet  3   No current facility-administered medications on file prior to visit.    PAST MEDICAL HISTORY: Past Medical History  Diagnosis Date  . Seizure disorder     Dr Danielle Gamble  . Allergic rhinitis     seasonal  . Hyperlipidemia   . Osteopenia 08/2010    T score -1.7 FRAX 5.7%/0.6%  . Ovarian cyst     PMH of  . Seizures     PAST SURGICAL HISTORY: Past Surgical History  Procedure Laterality Date  .  Ovarian cyst surgery      [place] in [place]  . Sternal cystectomy    . Colonoscopy  2012    negative, F/U in 2022([facility])  . Abdominal surgery      Expl. Laparotomy-Right ovar.cyst    FAMILY HISTORY: Family History  Problem Relation Age of Onset  . Prostate cancer Father   . Heart attack Father [id number]    CBAG; pacer  . Stroke Father 1480  . Alzheimer's disease Mother   . Coronary artery disease Paternal Uncle   . Diabetes Neg Hx     SOCIAL HISTORY:  History   Social History  . Marital Status: Married    Spouse Name: Danielle Gamble    Number of Children: 0  . Years of Education: college   Occupational History  .  [employer]   Social History Main Topics  . Smoking status: Never Smoker   . Smokeless tobacco: Never Used  . Alcohol Use: 0.6 oz/week    1 Glasses of wine per week     Comment: socially on weekends  . Drug Use: No  . Sexual Activity: Yes    Birth Control/ Protection: Other-see comments     Comment: VASECTOMY   Other Topics Concern  . Not on file  Social History Narrative   Patient  Lives at home with her husband Danielle Noa) Danielle Gamble    Patient works full time at [employer]   Right handed   Caffeine two cups daily     PHYSICAL EXAM   Filed Vitals:   04/16/13 1522  BP: 127/82  Pulse: 94  Height: 5' 8.75" (1.746 m)  Weight: 173 lb (78.472 kg)    Not recorded    Body mass index is 25.74 kg/(m^2).   Generalized: In no acute distress  Neck: Supple, no carotid bruits   Cardiac: Regular rate rhythm  Pulmonary: Clear to auscultation bilaterally  Musculoskeletal: No deformity  Neurological examination  Mentation: Alert oriented to time, place, history taking, and causual conversation  Cranial nerve II-XII: Pupils were equal round reactive to light. Extraocular movements were full.  Visual field were full on confrontational test. Bilateral fundi were sharp.  Facial sensation and strength were normal. Hearing was  intact to finger rubbing bilaterally. Uvula tongue midline.  Head turning and shoulder shrug and were normal and symmetric.Tongue protrusion into cheek strength was normal.  Motor: Normal tone, bulk and strength.  Sensory: Intact to fine touch, pinprick, preserved vibratory sensation, and proprioception at toes.  Coordination: Normal finger to nose, heel-to-shin bilaterally there was no truncal ataxia  Gait: Rising up from seated position without assistance, normal stance, without trunk ataxia, moderate stride, good arm swing, smooth turning, able to perform tiptoe, and heel walking without difficulty.   Romberg signs: Negative  Deep tendon reflexes: Brachioradialis 2/2, biceps 2/2, triceps 2/2, patellar 2/2, Achilles 2/2, plantar responses were flexor bilaterally.   DIAGNOSTIC DATA (LABS, IMAGING, TESTING) - I reviewed patient records, labs, notes, testing and imaging myself where available.  Lab Results  Component Value Date   WBC 5.3 11/15/2011   HGB 14.8 11/15/2011   HCT 45.1 11/15/2011   MCV 94.8 11/15/2011   PLT 215.0 11/15/2011      Component Value Date/Time   NA 140 11/15/2011 1043   K 3.8 11/15/2011 1043   CL 103 11/15/2011 1043   CO2 27 11/15/2011 1043   GLUCOSE 87 11/15/2011 1043   BUN 16 11/15/2011 1043   CREATININE 0.8 11/15/2011 1043   CALCIUM 9.8 11/15/2011 1043   PROT 7.6 11/13/2012 1546   ALBUMIN 4.4 11/13/2012 1546   AST 28 11/13/2012 1546   ALT 25 11/13/2012 1546   ALKPHOS 87 11/13/2012 1546   BILITOT 0.4 11/13/2012 1546   GFRNONAA 60.76 09/01/2009 1034   GFRAA 86 02/23/2006 1044   Lab Results  Component Value Date   CHOL 210* 11/13/2012   HDL 58.30 11/13/2012   LDLDIRECT 111.9 11/13/2012   TRIG 325.0* 11/13/2012   CHOLHDL 4 11/13/2012      ASSESSMENT AND PLAN  Danielle Gamble is a 53 y.o. female with past medical history of recurrent seizure, in 1995, and in 1997, normal MRI of the brain, EEG,  Has been doing very well on current dose of  lamotrigine 100 mg half tablet in the morning, one tablet in the evening,  I have refilled her lamotrigine for one year, she can continue her refill by her primary care physician Dr. Marga Gamble, only return to clinic if new issues arise    Levert Feinstein, M.D. Ph.D.  [facility] [address] [place], [place] [address] [phone]

## 2013-08-06 ENCOUNTER — Encounter: Payer: BC Managed Care – PPO | Admitting: Gynecology

## 2013-08-31 DIAGNOSIS — M8580 Other specified disorders of bone density and structure, unspecified site: Secondary | ICD-10-CM

## 2013-08-31 HISTORY — DX: Other specified disorders of bone density and structure, unspecified site: M85.80

## 2013-09-05 ENCOUNTER — Encounter: Payer: Self-pay | Admitting: Gynecology

## 2013-09-05 ENCOUNTER — Other Ambulatory Visit (HOSPITAL_COMMUNITY)
Admission: RE | Admit: 2013-09-05 | Discharge: 2013-09-05 | Disposition: A | Discharge status: Home / Self Care | Payer: BC Managed Care – PPO | Source: Ambulatory Visit | Attending: Gynecology | Admitting: Gynecology

## 2013-09-05 ENCOUNTER — Ambulatory Visit (INDEPENDENT_AMBULATORY_CARE_PROVIDER_SITE_OTHER): Payer: BC Managed Care – PPO | Admitting: Gynecology

## 2013-09-05 VITALS — BP 112/66 | Ht 69.0 in | Wt 172.0 lb

## 2013-09-05 DIAGNOSIS — M899 Disorder of bone, unspecified: Secondary | ICD-10-CM

## 2013-09-05 DIAGNOSIS — M8580 Other specified disorders of bone density and structure, unspecified site: Secondary | ICD-10-CM

## 2013-09-05 DIAGNOSIS — Z1151 Encounter for screening for human papillomavirus (HPV): Secondary | ICD-10-CM | POA: Insufficient documentation

## 2013-09-05 DIAGNOSIS — Z01419 Encounter for gynecological examination (general) (routine) without abnormal findings: Secondary | ICD-10-CM

## 2013-09-05 DIAGNOSIS — M949 Disorder of cartilage, unspecified: Secondary | ICD-10-CM

## 2013-09-05 NOTE — Addendum Note (Signed)
Addended by: Dayna BarkerGARDNER, KIMBERLY K on: 09/05/2013 03:27 PM   Modules accepted: Orders

## 2013-09-05 NOTE — Patient Instructions (Signed)
Followup in one year, sooner as needed.  You may obtain a copy of any labs that were done today by logging onto MyChart as outlined in the instructions provided with your AVS (after visit summary). The office will not call with normal lab results but certainly if there are any significant abnormalities then we will contact you.   Health Maintenance, Female A healthy lifestyle and preventative care can promote health and wellness.  Maintain regular health, dental, and eye exams.  Eat a healthy diet. Foods like vegetables, fruits, whole grains, low-fat dairy products, and lean protein foods contain the nutrients you need without too many calories. Decrease your intake of foods high in solid fats, added sugars, and salt. Get information about a proper diet from your caregiver, if necessary.  Regular physical exercise is one of the most important things you can do for your health. Most adults should get at least 150 minutes of moderate-intensity exercise (any activity that increases your heart rate and causes you to sweat) each week. In addition, most adults need muscle-strengthening exercises on 2 or more days a week.   Maintain a healthy weight. The body mass index (BMI) is a screening tool to identify possible weight problems. It provides an estimate of body fat based on height and weight. Your caregiver can help determine your BMI, and can help you achieve or maintain a healthy weight. For adults 20 years and older:  A BMI below 18.5 is considered underweight.  A BMI of 18.5 to 24.9 is normal.  A BMI of 25 to 29.9 is considered overweight.  A BMI of 30 and above is considered obese.  Maintain normal blood lipids and cholesterol by exercising and minimizing your intake of saturated fat. Eat a balanced diet with plenty of fruits and vegetables. Blood tests for lipids and cholesterol should begin at age 41 and be repeated every 5 years. If your lipid or cholesterol levels are high, you are over  50, or you are a high risk for heart disease, you may need your cholesterol levels checked more frequently.Ongoing high lipid and cholesterol levels should be treated with medicines if diet and exercise are not effective.  If you smoke, find out from your caregiver how to quit. If you do not use tobacco, do not start.  Lung cancer screening is recommended for adults aged 49 80 years who are at high risk for developing lung cancer because of a history of smoking. Yearly low-dose computed tomography (CT) is recommended for people who have at least a 30-pack-year history of smoking and are a current smoker or have quit within the past 15 years. A pack year of smoking is smoking an average of 1 pack of cigarettes a day for 1 year (for example: 1 pack a day for 30 years or 2 packs a day for 15 years). Yearly screening should continue until the smoker has stopped smoking for at least 15 years. Yearly screening should also be stopped for people who develop a health problem that would prevent them from having lung cancer treatment.  If you are pregnant, do not drink alcohol. If you are breastfeeding, be very cautious about drinking alcohol. If you are not pregnant and choose to drink alcohol, do not exceed 1 drink per day. One drink is considered to be 12 ounces (355 mL) of beer, 5 ounces (148 mL) of wine, or 1.5 ounces (44 mL) of liquor.  Avoid use of street drugs. Do not share needles with anyone. Ask for help  if you need support or instructions about stopping the use of drugs.  High blood pressure causes heart disease and increases the risk of stroke. Blood pressure should be checked at least every 1 to 2 years. Ongoing high blood pressure should be treated with medicines, if weight loss and exercise are not effective.  If you are 55 to 55 years old, ask your caregiver if you should take aspirin to prevent strokes.  Diabetes screening involves taking a blood sample to check your fasting blood sugar level.  This should be done once every 3 years, after age 45, if you are within normal weight and without risk factors for diabetes. Testing should be considered at a younger age or be carried out more frequently if you are overweight and have at least 1 risk factor for diabetes.  Breast cancer screening is essential preventative care for women. You should practice "breast self-awareness." This means understanding the normal appearance and feel of your breasts and may include breast self-examination. Any changes detected, no matter how small, should be reported to a caregiver. Women in their 20s and 30s should have a clinical breast exam (CBE) by a caregiver as part of a regular health exam every 1 to 3 years. After age 40, women should have a CBE every year. Starting at age 40, women should consider having a mammogram (breast X-ray) every year. Women who have a family history of breast cancer should talk to their caregiver about genetic screening. Women at a high risk of breast cancer should talk to their caregiver about having an MRI and a mammogram every year.  Breast cancer gene (BRCA)-related cancer risk assessment is recommended for women who have family members with BRCA-related cancers. BRCA-related cancers include breast, ovarian, tubal, and peritoneal cancers. Having family members with these cancers may be associated with an increased risk for harmful changes (mutations) in the breast cancer genes BRCA1 and BRCA2. Results of the assessment will determine the need for genetic counseling and BRCA1 and BRCA2 testing.  The Pap test is a screening test for cervical cancer. Women should have a Pap test starting at age 21. Between ages 21 and 29, Pap tests should be repeated every 2 years. Beginning at age 30, you should have a Pap test every 3 years as long as the past 3 Pap tests have been normal. If you had a hysterectomy for a problem that was not cancer or a condition that could lead to cancer, then you no  longer need Pap tests. If you are between ages 65 and 70, and you have had normal Pap tests going back 10 years, you no longer need Pap tests. If you have had past treatment for cervical cancer or a condition that could lead to cancer, you need Pap tests and screening for cancer for at least 20 years after your treatment. If Pap tests have been discontinued, risk factors (such as a new sexual partner) need to be reassessed to determine if screening should be resumed. Some women have medical problems that increase the chance of getting cervical cancer. In these cases, your caregiver may recommend more frequent screening and Pap tests.  The human papillomavirus (HPV) test is an additional test that may be used for cervical cancer screening. The HPV test looks for the virus that can cause the cell changes on the cervix. The cells collected during the Pap test can be tested for HPV. The HPV test could be used to screen women aged 30 years and older, and   should be used in women of any age who have unclear Pap test results. After the age of 30, women should have HPV testing at the same frequency as a Pap test.  Colorectal cancer can be detected and often prevented. Most routine colorectal cancer screening begins at the age of 50 and continues through age 75. However, your caregiver may recommend screening at an earlier age if you have risk factors for colon cancer. On a yearly basis, your caregiver may provide home test kits to check for hidden blood in the stool. Use of a small camera at the end of a tube, to directly examine the colon (sigmoidoscopy or colonoscopy), can detect the earliest forms of colorectal cancer. Talk to your caregiver about this at age 50, when routine screening begins. Direct examination of the colon should be repeated every 5 to 10 years through age 75, unless early forms of pre-cancerous polyps or small growths are found.  Hepatitis C blood testing is recommended for all people born from  1945 through 1965 and any individual with known risks for hepatitis C.  Practice safe sex. Use condoms and avoid high-risk sexual practices to reduce the spread of sexually transmitted infections (STIs). Sexually active women aged 25 and younger should be checked for Chlamydia, which is a common sexually transmitted infection. Older women with new or multiple partners should also be tested for Chlamydia. Testing for other STIs is recommended if you are sexually active and at increased risk.  Osteoporosis is a disease in which the bones lose minerals and strength with aging. This can result in serious bone fractures. The risk of osteoporosis can be identified using a bone density scan. Women ages 65 and over and women at risk for fractures or osteoporosis should discuss screening with their caregivers. Ask your caregiver whether you should be taking a calcium supplement or vitamin D to reduce the rate of osteoporosis.  Menopause can be associated with physical symptoms and risks. Hormone replacement therapy is available to decrease symptoms and risks. You should talk to your caregiver about whether hormone replacement therapy is right for you.  Use sunscreen. Apply sunscreen liberally and repeatedly throughout the day. You should seek shade when your shadow is shorter than you. Protect yourself by wearing long sleeves, pants, a wide-brimmed hat, and sunglasses year round, whenever you are outdoors.  Notify your caregiver of new moles or changes in moles, especially if there is a change in shape or color. Also notify your caregiver if a mole is larger than the size of a pencil eraser.  Stay current with your immunizations. Document Released: 08/02/2010 Document Revised: 05/14/2012 Document Reviewed: 08/02/2010 Exit[employer] Patient Information 2014 [employer].   

## 2013-09-05 NOTE — Progress Notes (Signed)
Danielle LemonsRoberta R Beigel [birth year] [id number]        53 y.o.  G0P0 for annual exam.  Several issues noted below.  Past medical history,surgical history, problem list, medications, allergies, family history and social history were all reviewed and documented as reviewed in the EPIC chart.  ROS:  12 system ROS performed with pertinent positives and negatives included in the history, assessment and plan.   Additional significant findings :  None   Exam: [facility] person[facility]istant Filed Vitals:   09/05/13 1429  BP: 112/66  Height: 5\' 9"  (1.753 m)  Weight: 172 lb (78.019 kg)   General appearance:  Normal affect, orientation and appearance. Skin: Grossly normal HEENT: Without gross lesions.  No cervical or supraclavicular adenopathy. Thyroid normal.  Lungs:  Clear without wheezing, rales or rhonchi Cardiac: RR, without RMG Abdominal:  Soft, nontender, without masses, guarding, rebound, organomegaly or hernia Breasts:  Examined lying and sitting without masses, retractions, discharge or axillary adenopathy. Pelvic:  Ext/BUS/vagina with general atrophic changes  Cervix with atrophic changes. Pap/HPV  Uterus anteverted, normal size, shape and contour, midline and mobile nontender   Adnexa  Without masses or tenderness    Anus and perineum  Normal   Rectovaginal  Normal sphincter tone without palpated masses or tenderness.    Assessment/Plan:  53 y.o. G0P0 female for annual exam.   1. Postmenopausal/atrophic genital changes. Patient without significant symptoms of hot flushes, night sweats, vaginal dryness or dyspareunia. No vaginal bleeding. Continue to monitor. Report any vaginal bleeding. 2. Osteopenia. DEXA 08/2010 T score -1.7 FRAX 5.7%/0.6%. Repeat DEXA now. Increase calcium vitamin D reviewed. 3. Pap smear 2012. Pap/HPV today. No history of abnormal Pap smears previously. Plan repeat Pap smear in 3-5 year interval assuming this Pap smear is normal per current screening guidelines. 4. Mammography  scheduled next week. Continue with annual mammography. SBE monthly reviewed. 5. Colonoscopy 2010. Repeat at their recommended interval. 6. Health maintenance. No routine blood work done as this is done at her primary physician's office. Followup for DEXA, otherwise annually.   Note: This document was prepared with digital dictation and possible smart phrase technology. Any transcriptional errors that result from this process are unintentional.   Dara LordsFONTAINE,Demontrez Rindfleisch P MD, 3:13 PM 09/05/2013

## 2013-09-09 ENCOUNTER — Telehealth: Payer: Self-pay | Admitting: *Deleted

## 2013-09-09 LAB — CYTOLOGY - PAP

## 2013-09-09 NOTE — Telephone Encounter (Signed)
Message copied by Aura CampsWEBB, Deborra Phegley L on Mon Sep 09, 2013  8:38 AM ------      Message from: Dara LordsFONTAINE, TIMOTHY P      Created: Thu Sep 05, 2013  3:15 PM       [facility] ------

## 2013-09-09 NOTE — Telephone Encounter (Signed)
Appointment on 09/11/13 @ 1:15pm, pt aware and order faxed.

## 2013-09-12 ENCOUNTER — Encounter: Payer: Self-pay | Admitting: Gynecology

## 2013-09-16 ENCOUNTER — Encounter: Payer: Self-pay | Admitting: Gynecology

## 2013-09-17 ENCOUNTER — Telehealth: Payer: Self-pay | Admitting: Gynecology

## 2013-09-17 ENCOUNTER — Encounter: Payer: Self-pay | Admitting: Gynecology

## 2013-09-17 DIAGNOSIS — M898X9 Other specified disorders of bone, unspecified site: Secondary | ICD-10-CM

## 2013-09-17 DIAGNOSIS — M8580 Other specified disorders of bone density and structure, unspecified site: Secondary | ICD-10-CM

## 2013-09-17 NOTE — Telephone Encounter (Signed)
Tell patient her bone density shows a little bit of bone loss but overall appears stable. Would recommend checking a vitamin D level.

## 2013-09-17 NOTE — Telephone Encounter (Signed)
PT INFORMED WITH THE BELOW NOTE, ORDER PLACED PT WILL BE IN ON 09/23/13 @ 4:30PM

## 2013-09-23 ENCOUNTER — Encounter: Payer: Self-pay | Admitting: Internal Medicine

## 2013-09-23 ENCOUNTER — Other Ambulatory Visit: Payer: BC Managed Care – PPO

## 2013-09-23 DIAGNOSIS — M8580 Other specified disorders of bone density and structure, unspecified site: Secondary | ICD-10-CM

## 2013-09-23 DIAGNOSIS — M898X9 Other specified disorders of bone, unspecified site: Secondary | ICD-10-CM

## 2013-09-24 LAB — VITAMIN D 25 HYDROXY (VIT D DEFICIENCY, FRACTURES): VIT D 25 HYDROXY: 41 ng/mL (ref 30–89)

## 2013-11-13 ENCOUNTER — Encounter: Payer: Self-pay | Admitting: Internal Medicine

## 2013-11-13 ENCOUNTER — Ambulatory Visit (INDEPENDENT_AMBULATORY_CARE_PROVIDER_SITE_OTHER): Payer: BC Managed Care – PPO | Admitting: Internal Medicine

## 2013-11-13 VITALS — BP 132/90 | HR 102 | Temp 98.9°F | Resp 12 | Wt 169.8 lb

## 2013-11-13 DIAGNOSIS — J069 Acute upper respiratory infection, unspecified: Secondary | ICD-10-CM

## 2013-11-13 MED ORDER — AMOXICILLIN 500 MG PO CAPS
500.0000 mg | ORAL_CAPSULE | Freq: Three times a day (TID) | ORAL | Status: DC
Start: 2013-11-13 — End: 2014-05-19

## 2013-11-13 NOTE — Patient Instructions (Signed)
Plain Mucinex (NOT D) for thick secretions ;force NON dairy fluids .   Nasal cleansing in the shower as discussed with lather of mild shampoo.After 10 seconds wash off lather while  exhaling through nostrils. Make sure that all residual soap is removed to prevent irritation.  Flonase OR Nasacort AQ 1 spray in each nostril twice a day as needed. Use the "crossover" technique into opposite nostril spraying toward opposite ear @ 45 degree angle, not straight up into nostril.  Use a Neti pot daily only  as needed for significant sinus congestion; going from open side to congested side . Plain Allegra (NOT D )  160 daily , Loratidine 10 mg , OR Zyrtec 10 mg @ bedtime  as needed for itchy eyes & sneezing.  Zicam Melts or Zinc lozenges as per package label for sore throat . Complementary options include  vitamin C 2000 mg daily; & Echinacea for 4-7 days. Report persistent or progressive fever; discolored nasal or chest secretions; or frontal headache or facial  pain.   Fill the  prescription for antibiotic it there is not dramatic improvement in the next  48-72 hours.     

## 2013-11-13 NOTE — Progress Notes (Signed)
Pre visit review using our clinic review tool, if applicable. No additional management support is needed unless otherwise documented below in the visit note. 

## 2013-11-13 NOTE — Progress Notes (Signed)
   Subjective:    Patient ID: Danielle Gamble, female    DOB: [birth year], 53 y.o.   MRN: [id number]  HPI   Symptoms began 11/08/13 as fever and chest congestion. Temp max 102. The cough is mainly nonproductive but does produce some scant clear material  As of 11/10/13 she has had head congestion. She also has left otic pain. There is pressure in the maxillary greater than frontal sinuses. She has associated postnasal drainage and sore throat  Aleve, TheraFlu, Mucinex DM  employed. The last did cause head pressure.    Review of Systems  She denies nasal purulence or significant extrinsic symptoms. Extrinsic symptoms of itchy, watery eyes, sneezing, or angioedema are denied. There is no wheezing or  paroxysmal nocturnal dyspnea.  No arthralgias or myalgias.     Objective:   Physical Exam  Positive or pertinent findings include: The light reflex in the left tympanic membrane is diffuse. There is no erythema or bulging. She exhibits hyponasal speech.   General appearance:good health ;well nourished; no acute distress or increased work of breathing is present.  No  lymphadenopathy about the head, neck, or axilla noted.  Eyes: No conjunctival inflammation or lid edema is present. There is no scleral icterus. Ears:  External ear exam shows no significant lesions or deformities.  R TM essentially WNL  Nose:  External nasal examination shows no deformity or inflammation. Nasal mucosa are pink and moist without lesions or exudates. No septal dislocation or deviation.No obstruction to airflow.   Oral exam: Dental hygiene is good; lips and gums are healthy appearing.There is no oropharyngeal erythema or exudate noted.   Neck:  No deformities, thyromegaly, masses, or tenderness noted.   Supple with full range of motion without pain.   Heart:  Normal rate and regular rhythm. S1 and S2 normal without gallop, murmur, click, rub or other extra sounds.   Lungs:Chest clear to auscultation; no  wheezes, rhonchi,rales ,or rubs present.No increased work of breathing.    Extremities:  No cyanosis, edema, or clubbing  noted    Skin: Warm & dry w/o jaundice or tenting.        Assessment & Plan:  #1 viral URI See AVS

## 2013-11-14 ENCOUNTER — Other Ambulatory Visit: Payer: Self-pay | Admitting: Internal Medicine

## 2014-01-23 ENCOUNTER — Ambulatory Visit (INDEPENDENT_AMBULATORY_CARE_PROVIDER_SITE_OTHER): Payer: BC Managed Care – PPO | Admitting: Internal Medicine

## 2014-01-23 ENCOUNTER — Encounter: Payer: Self-pay | Admitting: Internal Medicine

## 2014-01-23 VITALS — BP 104/64 | HR 87 | Temp 98.6°F | Resp 12 | Wt 170.0 lb

## 2014-01-23 DIAGNOSIS — E042 Nontoxic multinodular goiter: Secondary | ICD-10-CM

## 2014-01-23 NOTE — Progress Notes (Addendum)
Subjective:     Patient ID: Danielle Gamble, female   DOB: 1960/09/24, 53 y.o.   MRN: [id number]  HPI Ms Danielle Gamble is a pleasant 53 y.o. woman initially referred by Dr. Marga MelnickWilliam Gamble, for MNG, now returning for followup. Last visit a year ago. She is here today with her husband.  Reviewed history: Pt. Found a right thyroid nodule, later also palpated by PCP during her Annual Physical Exam in 2013. A thyroid U/S, on 11/15/2011 showed MNG, with bilateral subcm nodules, but also 4 larger nodules: largest = 1.3 cm right lower pole nodule, solid, containing microcalcifications. Adjacent 11 mm and 10 mm lower pole nodules, also solid. Also, there was a 10 mm solid nodule.  She was then sent for FNA of the dominant right nodule, however, the sample was not considered adequate due to too little epithelium, obscuring blood and crush artifact. The tissue that was seen was considered benign. Patient was referred to me for further discussion since she was very anxious about the result.   At last visit, we repeated the thyroid U/S: stable nodules bilaterally.  She returns now, a year after last visit. No neck compression sxs. No signs or sxs of hypo or hyperthyroidism.   Last TSH was normal: Lab Results  Component Value Date   TSH 1.59 12/04/2012   Review of Systems Constitutional: no weight gain/loss, no fatigue, no subjective hyperthermia/hypothermia Eyes: no blurry vision, no xerophthalmia ENT: no sore throat, no nodules palpated in throat, no dysphagia/odynophagia, no hoarseness Cardiovascular: no CP/SOB/palpitations/leg swelling Respiratory: no cough/SOB Gastrointestinal: no N/V/D/C Musculoskeletal: no muscle/joint aches Skin: no rashes Neurological: no tremors/numbness/tingling/dizziness Psychiatric: no depression/anxiety  I reviewed pt's medications, allergies, PMH, social hx, family hx, and changes were documented in the history of present illness. Otherwise, unchanged from my initial visit  note.  Objective:   Physical Exam BP 104/64 mmHg  Pulse 87  Temp(Src) 98.6 F (37 C) (Oral)  Resp 12  Wt 170 lb (77.111 kg)  SpO2 97%    Wt Readings from Last 3 Encounters:  01/23/14 170 lb (77.111 kg)  11/13/13 169 lb 12 oz (76.998 kg)  09/05/13 172 lb (78.019 kg)   Constitutional: in NAD Eyes: PERRLA, EOMI, no exophthalmos ENT: moist mucous membranes, no thyromegaly, nodule palpated in right antero-lateral neck, no cervical lymphadenopathy Cardiovascular: RRR, No MRG Respiratory: CTA B Gastrointestinal: abdomen soft, NT, ND, BS+ Musculoskeletal: no deformities, strength intact in all 4 Skin: moist, warm, no rashes Neurological: no tremor with outstretched hands, DTR normal in all 4  Assessment:     1. MNG - 2013 Thyroid U/S: - R lobe: 1.3 cm nodule with microcalcifications >> FNA nondiagnostic/unsatisfactory  1.1 cm nodule  1.0 cm nodule - L lobe: 1.0 cm nodule  - 12/18/2011: FNA R 1.3 cm nodule: Adequacy Reason Satisfactory But Limited For Evaluation Due to Scant Follicular Epithelium, Obscuring Blood and Crush Artifact. Diagnosis THYROID, FINE NEEDLE ASPIRATION, RIGHT: Scant follicular epithelium admixed with blood and scant colloid. Comment: in areas where the cells are well preserved and well visualized they appear benign and a hyperplastic nodule is favored. However, the obscuring blood, crush artifact and scant amount of follicular epithelium is limiting.   - 12/11/2012: Thyroid U/S: - Right thyroid lobe: 5.6 x 1.9 x 2.0 cm. Similar pattern of right mid to lower pole thyroid nodules. These nodules are unchanged in size or sonographic appearance. Dominant nodule in the right inferior thyroid gland measures 1.4 x 1.0 x 1.0 cm. All of the other right  thyroid nodules measure 1 cm or less in size. No new or enlarging suspicious thyroid nodule. No lesion meets criteria for repeat biopsy. - Left thyroid lobe: 4.7 x 1.3 x 1.6 cm. Stable 9 mm left inferior pole solid  thyroid nodule - Isthmus Thickness: 2 mm. No nodules visualized. - Lymphadenopathy Nonvisualized IMPRESSION: Stable bilateral thyroid nodules.  Plan:     2. MNG: We reviewed the images of her thyroid U/S from 12/2012 together with the pt. She has 3 thyroid nodules in the R lobe: 1 x 1.4 cm, and the rest 1 cm and 1x 1 cm nodule in the L lobe. The dominant nodule was Bx'ed in 2013, but scan cellularity >> will repeat the Bx. Since she is very anxious about the nature of the nodules, I suggested that she had a repeat FNA in a not only of the dominant nodule, but of all 4 nodules. If this is negative for malignancy, we can then follow the goiter on a more relaxed basis, repeating the U/S probably after another 3 years, and I will see her in 2 years. - pt agrees to plan >> I placed the order for the FNA to be done in [facility]. - reviewed TSH from 1 year ago >> normal - I will see the patient back in 2 years; however, if biopsy is positive for malignancy, I would see her sooner. Shew also will let me know if she develops neck compression sxs.  Orders Placed This Encounter  Procedures  . US Soft Tissue Head/Neck  . US Thyroid Biopsy  . US Thyroid Biopsy  . US Thyroid Biopsy  . US Thyroid Biopsy   I will addend the results when they become available.  02/20/2014  There is unchanged diffuse heterogeneity of thyroid parenchymal echotexture. No definitive new or enlarging thyroid nodules.  Right thyroid lobe  Measurements: Normal in size measuring 6.2 x 2.2 x 2.0 cm, grossly unchanged, previously, 5.6 x 1.9 x 2.0 cm.  Right, superior- 0.5 cm - hypoechoic, likely solid - unchanged since the 12/2012 examination.  Right, mid, medial - 0.8 x 0.4 x 0.6 cm - mixed echogenic, solid - unchanged decreased in size since the 12/11/2012 examination, previously, 1.0 x 0.4 x 0.9 cm.  Right, mid - 0.8 x 0.5 x 0.9 cm - mixed echogenic, solid - grossly unchanged since the 12/2009 examination  though with slightly less cystic component.  Right, mid, posterior - 1.0 x 0.6 x 0.7 cm - Mixed echogenic, partially cystic, partially solid - unchanged to slightly decreased in size since the 11/2011 examination at which time the nodule measured approximately 0.9 cm.  Right, mid, posterior - 1.3 x 1.0 x 1.0 cm - mixed echogenic, solid -, grossly unchanged since the 11/2011 examination at which time the module measuring approximately 1.1 x 0.8 cm with slight differences likely attributable to scan plane projection.  Right, inferior - 1.2 x 1.0 x 1.0 cm - mixed echogenic, solid - unchanged since the 12/2012 examination, previously, 1.1 x 1.0 x 1.0 cm.  Right, inferior, anterior - 0.7 x 0.5 x 0.8 cm - partially solid, predominantly cystic - unchanged since the 12/2012 examination - unchanged in hind site since the 12/2012 examination.  Right, inferior, anterior - 0.5 x 0.4 x 0.6 cm - mixed echogenic, solid - unchanged in hind site since the 12/2012 examination  *Right, inferior - 1.2 x 1.2 x 1.1 cm - mixed echogenic, solid - unchanged since the 11/2011 examination. Note, this nodule was previously biopsied  Left thyroid lobe  Measurements: Normal in size measuring 5.0 x 1.8 x 1.6 cm, grossly unchanged, previously, 4.7 x 1.3 x 1.6 cm.  Left, mid - 0.4 cm - hypoechoic, likely cystic.  Left, inferior, posterior - 0.9 x 0.9 x 0.8 cm - mixed echogenic, partially cystic, predominantly solid - decreased in size since the 11/2011 examination at which time the nodule measured approximately 1.0 x 1.0 x 0.8 cm.  Isthmus  Thickness: Normal in size measuring 0.2 cm in diameter, unchanged.  No discrete nodules are identified when the thyroid isthmus.  Lymphadenopathy  None identified.  IMPRESSION: Similar findings of multi nodular goiter. No definitive new or enlarging thyroid nodules.   Electronically Signed By: Simonne Come M.D. On: 02/20/2014 15:35  Awaiting Bx  results.

## 2014-01-23 NOTE — Patient Instructions (Signed)
Please return in 2 years. You will be called about the schedule for the thyroid U/S and the biopsies.

## 2014-02-20 ENCOUNTER — Ambulatory Visit: Admission: RE | Admit: 2014-02-20 | Payer: BC Managed Care – PPO | Source: Ambulatory Visit

## 2014-02-20 ENCOUNTER — Ambulatory Visit
Admission: RE | Admit: 2014-02-20 | Discharge: 2014-02-20 | Disposition: A | Discharge status: Home / Self Care | Payer: BC Managed Care – PPO | Source: Ambulatory Visit | Attending: Internal Medicine | Admitting: Internal Medicine

## 2014-02-27 ENCOUNTER — Encounter: Payer: Self-pay | Admitting: *Deleted

## 2014-03-20 IMAGING — US US SOFT TISSUE HEAD/NECK
1 series · 14 of 25 positions shown · non-contrast
Comparison: None.

CLINICAL DATA: Question right thyroid nodule on physical exam.

THYROID ULTRASOUND
TECHNIQUE: Ultrasound examination of the thyroid gland and adjacent
soft tissues was performed.

[Series 1: us soft tissue head/neck · 0.05mm/px · 14 of 78 slices shown]
[im 1/78]
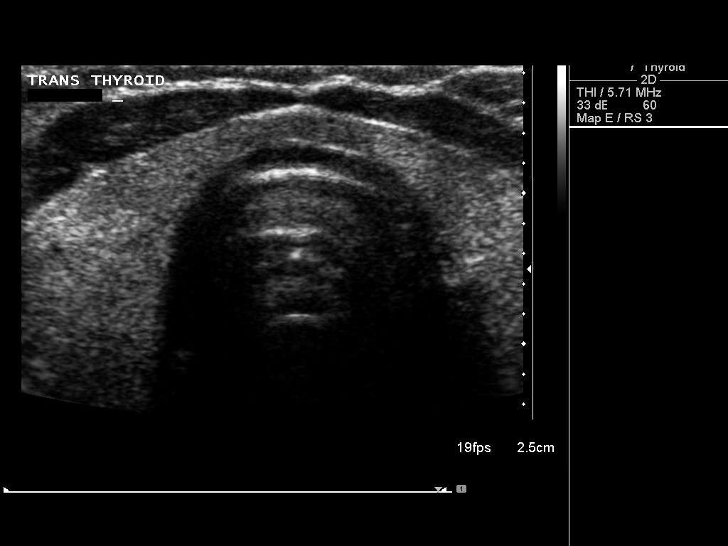
[im 7/78]
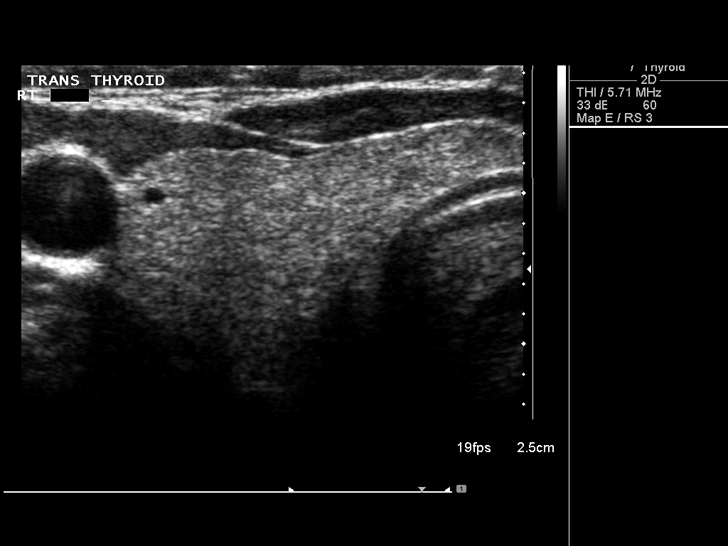
[im 13/78]
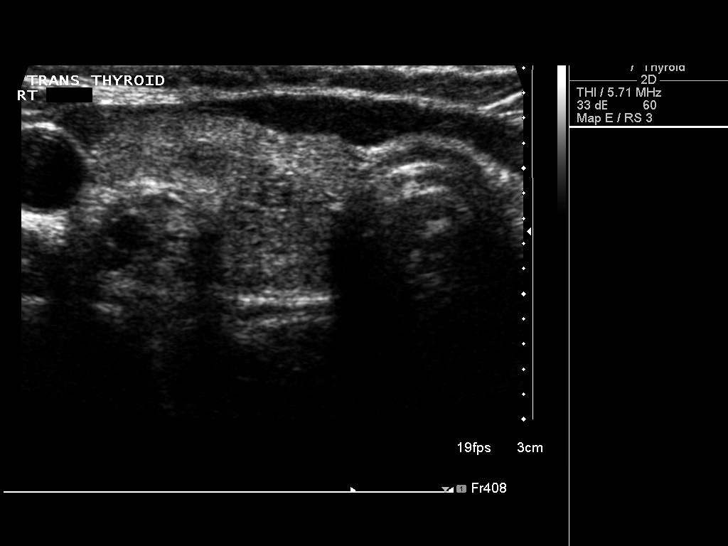
[im 20/78]
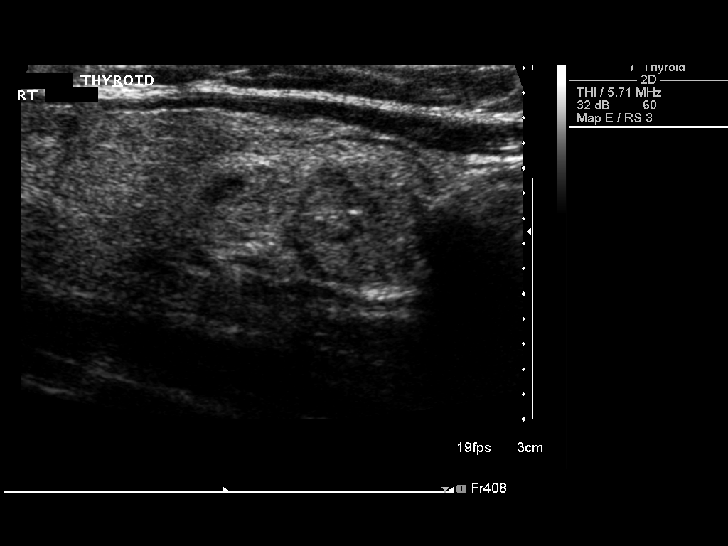
[im 26/78]
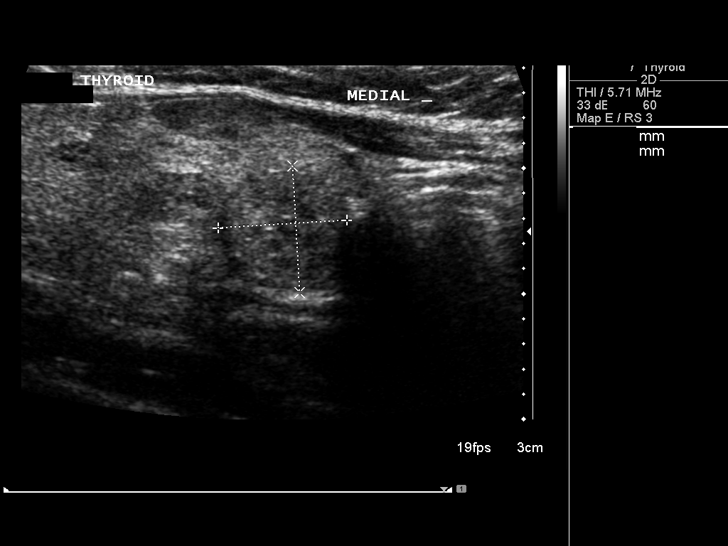
[im 29/78]
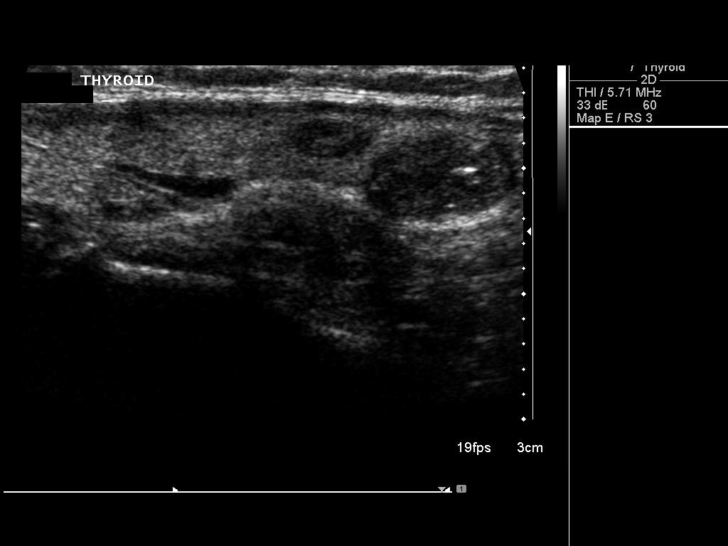
[im 36/78]
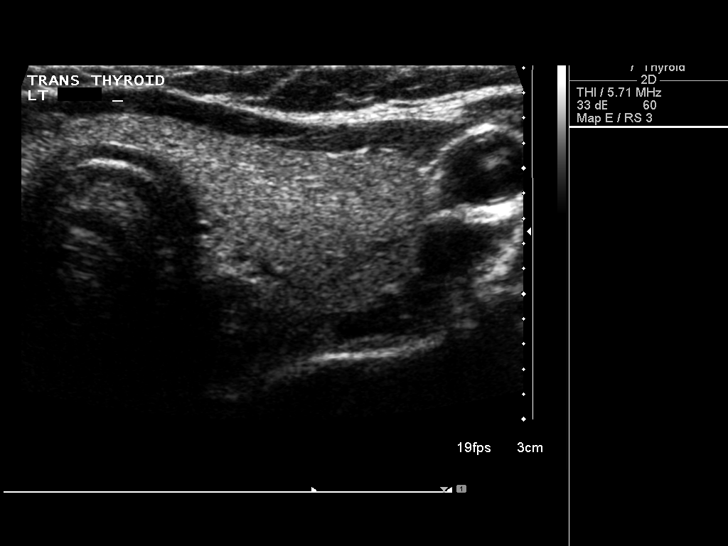
[im 42/78]
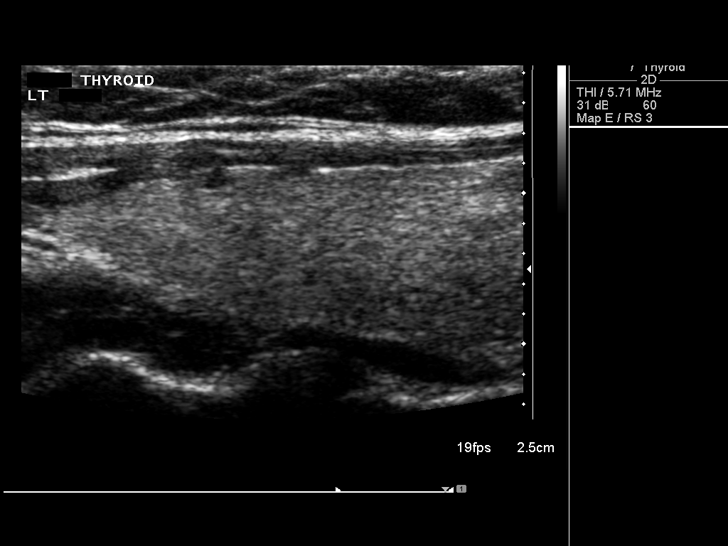
[im 49/78]
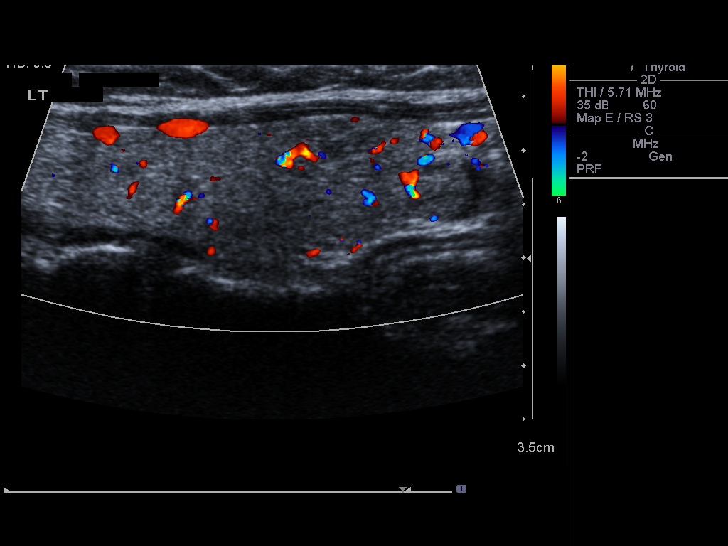
[im 52/78]
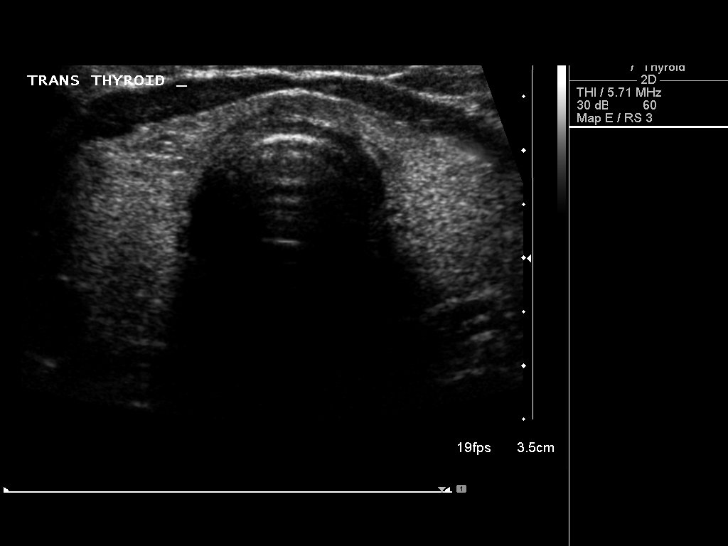
[im 58/78]
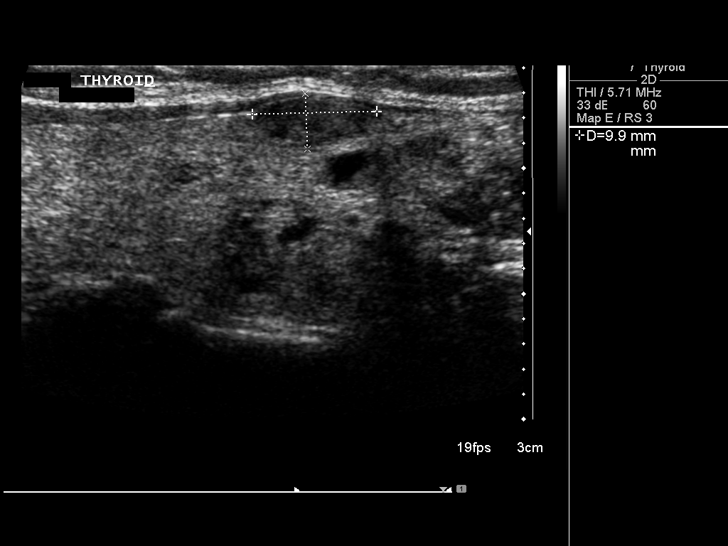
[im 65/78]
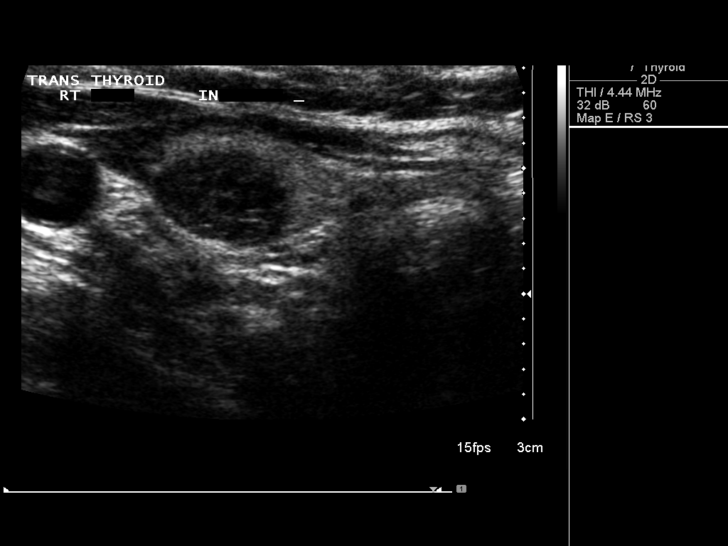
[im 71/78]
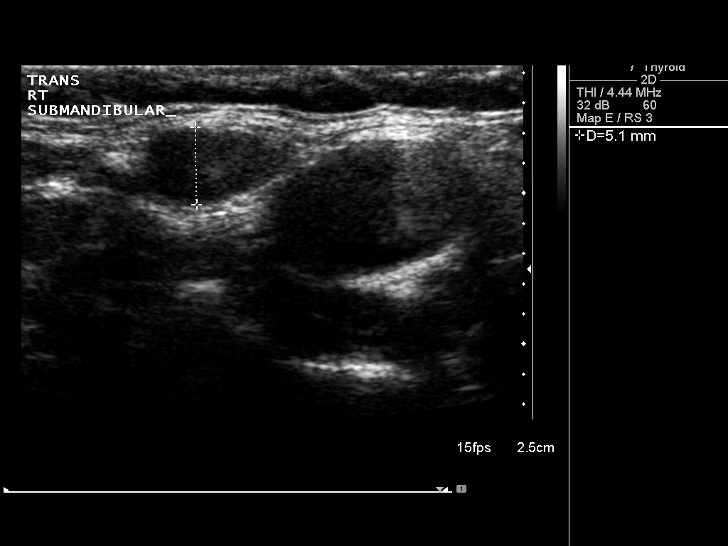
[im 78/78]
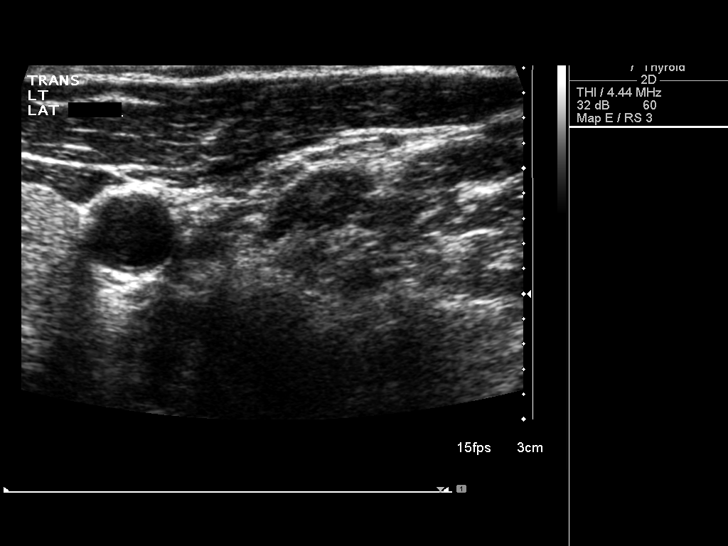

[14 of 25 positions shown; findings below may reference images not displayed]

FINDINGS: Right thyroid lobe:  5.9 x 1.6 x 2.1 cm.
Left thyroid lobe:  5.5 x 1.4 x 1.9 cm.
Isthmus:  2 mm.

Focal nodules:  Bilateral thyroid nodules.  The largest on the
right is a 1.3 cm right lower pole nodule which is solid and
contains microcalcifications.  Adjacent 11 mm and 10 mm lower pole
nodules which are solid.  10 mm solid nodule in the lower pole of
the left thyroid lobe.  Scattered subcentimeteric thyroid nodules.

Lymphadenopathy: None visualized.
IMPRESSION: Bilateral thyroid nodules as above.  There is a 13 mm nodule in the
lower pole of the right thyroid lobe containing
microcalcifications.  Given the microcalcifications, may consider
tissue sampling of this nodule versus continued close interval
follow-up.

## 2014-04-12 IMAGING — US US THYROID BIOPSY
1 series · 11 of 11 positions shown · non-contrast
Comparison: none

Clinical Data/Indication: Dominant right lower pole nodule with
microcalcifications.

ULTRASOUND-GUIDED BIOPSY OF A RIGHT LOWER POLE NODULE WITH
MICROCALCIFICATIONS.  FINE NEEDLE ASPIRATION.
Procedure: The procedure, risks, benefits, and alternatives were
explained to the patient. Questions regarding the procedure were
encouraged and answered. The patient understands and consents to
the procedure.
The right neck was prepped with betadine in a sterile fashion, and
a sterile drape was applied covering the operative field. A mask
and sterile gloves were used for the procedure.
Under sonographic guidance, three 25 gauge fine needle aspirates of
the dominant right lower pole nodule containing microcalcifications
were obtained. Final imaging was performed.

[Series 1: us thyroid biopsy · 0.06mm/px · 11 acquisitions, 11 frames shown]
[im 1/11]
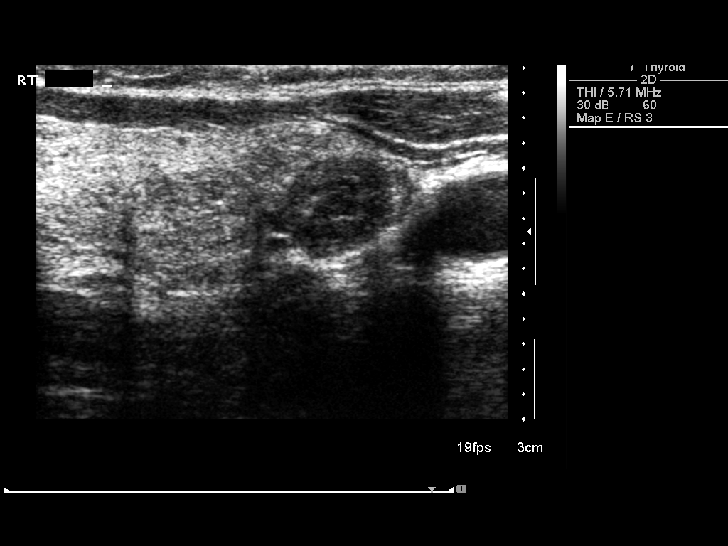
[im 2/11]
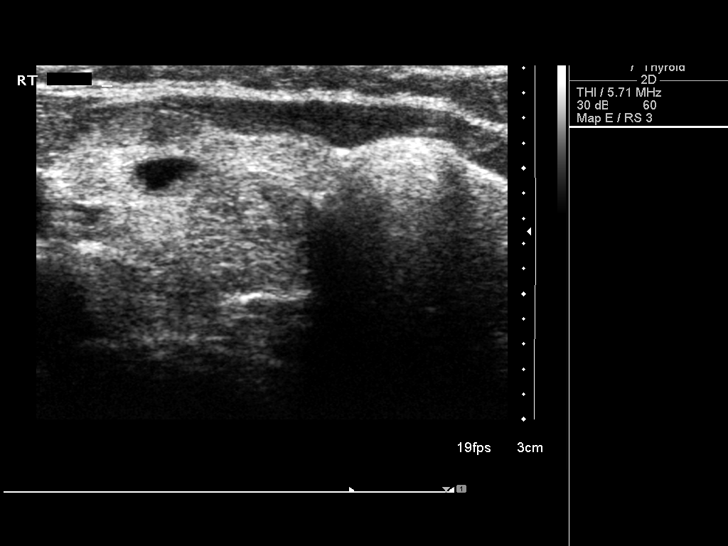
[im 3/11]
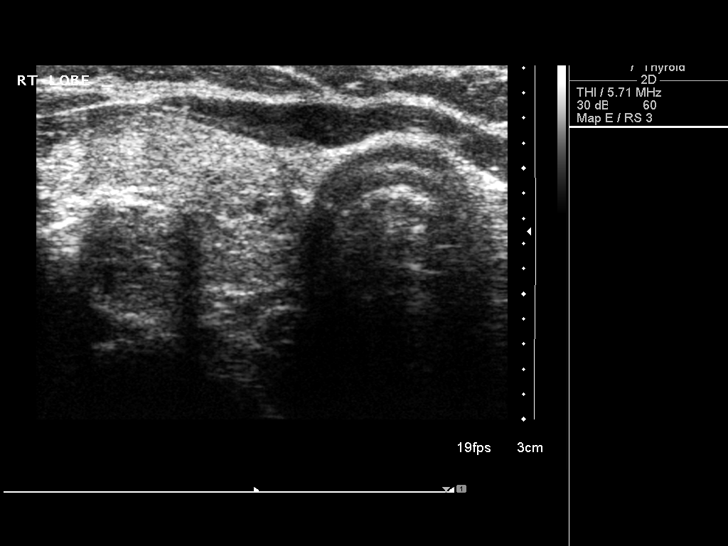
[im 4/11]
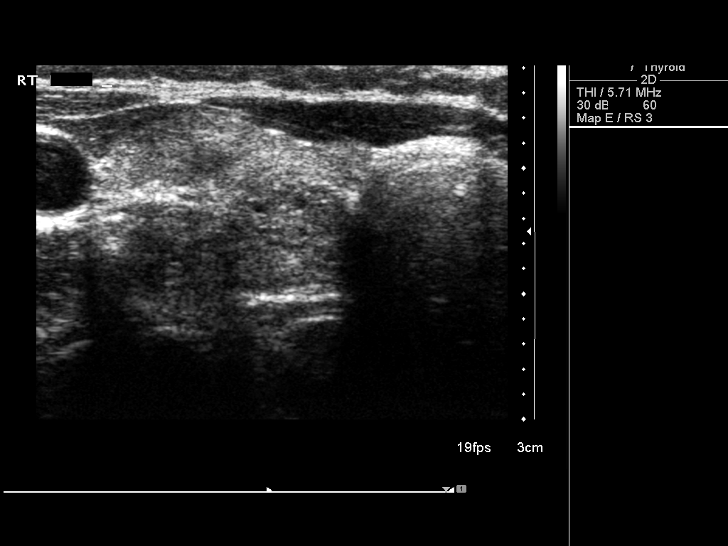
[im 5/11]
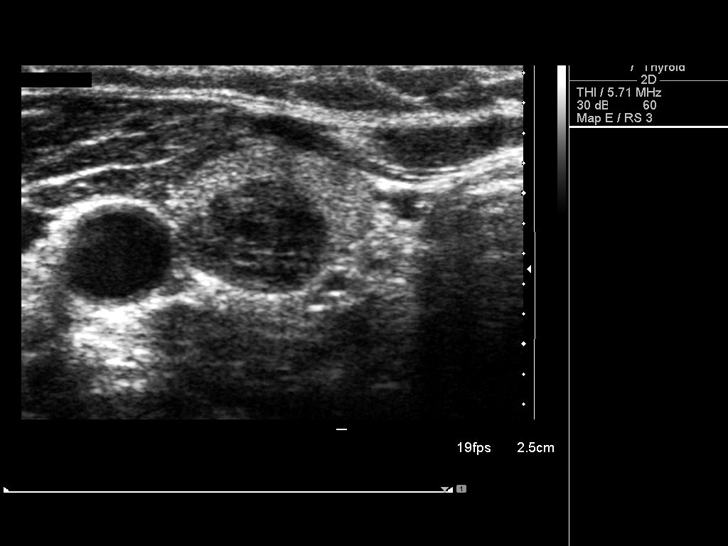
[im 6/11]
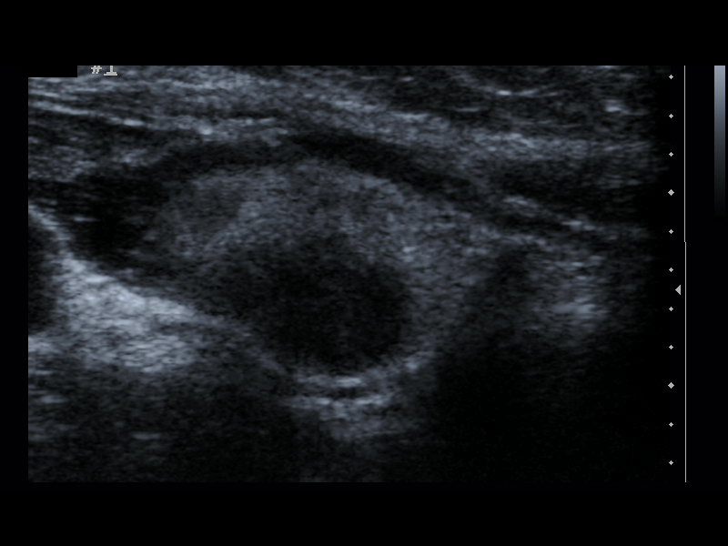
[im 7/11]
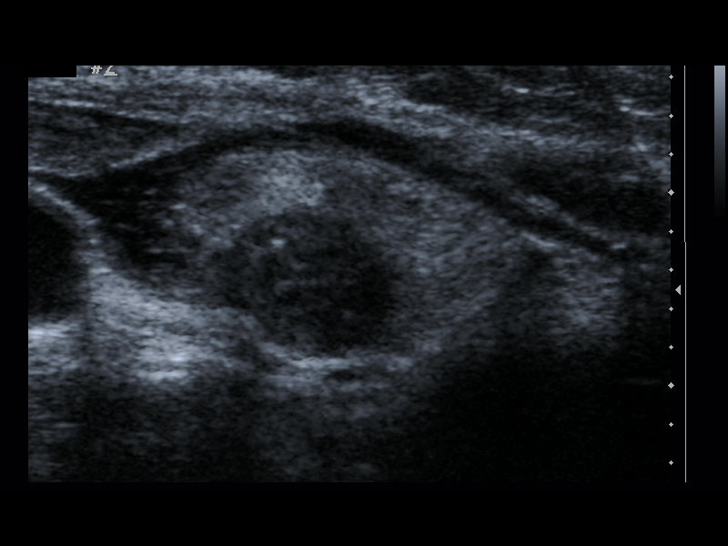
[im 8/11]
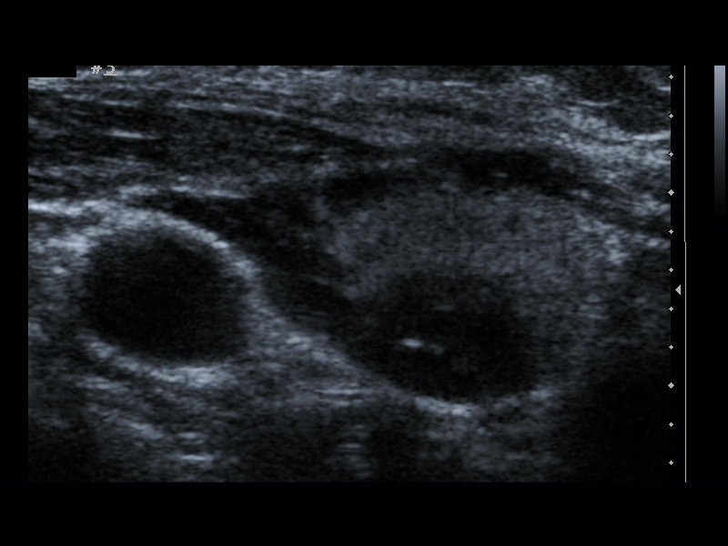
[im 9/11]
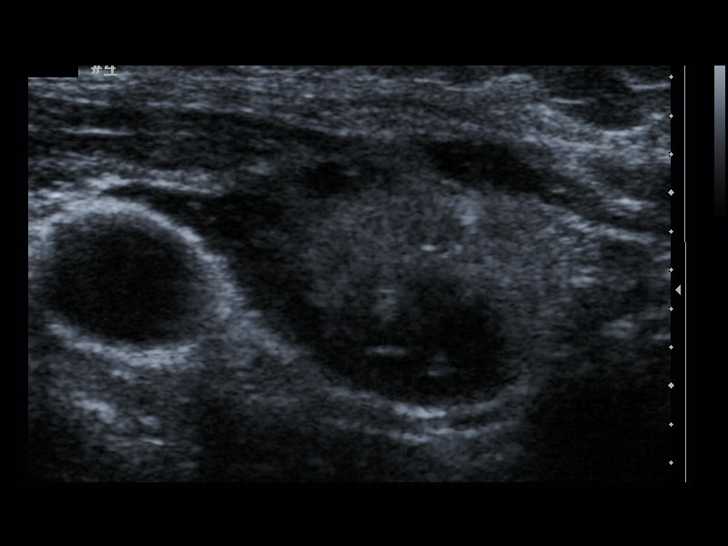
[im 10/11]
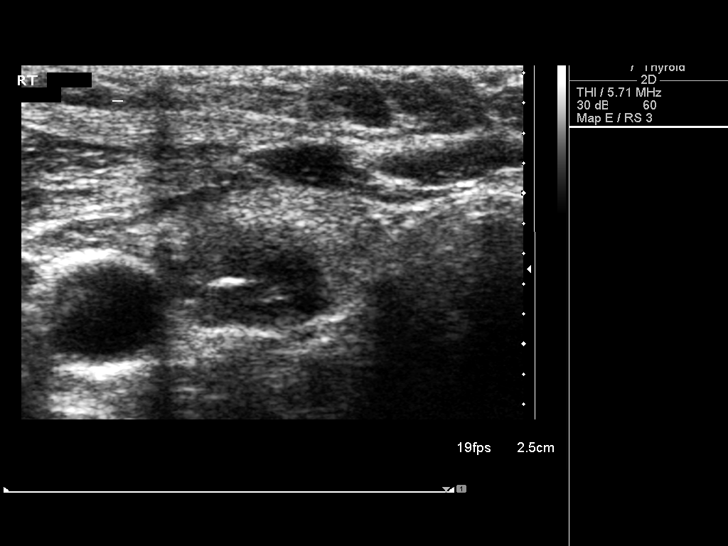
[im 11/11]
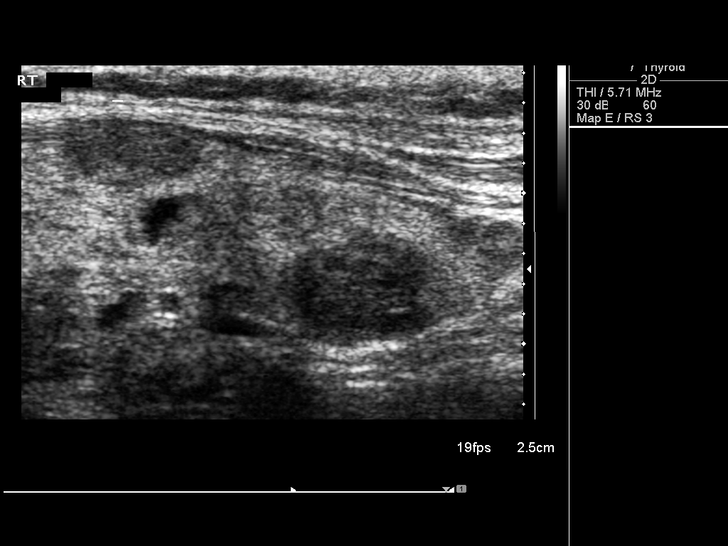

[11 of 11 positions shown; findings below may reference images not displayed]

FINDINGS: The images document guide needle placement within the
right lower pole nodule. Post biopsy images demonstrate no evidence
of hemorrhage.
IMPRESSION: Successful ultrasound-guided fine needle aspiration of a right
lower pole nodule with microcalcifications.

## 2014-04-21 ENCOUNTER — Other Ambulatory Visit: Payer: Self-pay | Admitting: Neurology

## 2014-04-21 NOTE — Telephone Encounter (Signed)
Last OV note says: I have refilled her lamotrigine for one year, she can continue her refill by her primary care physician Dr. Marga MelnickWilliam Hopper

## 2014-04-30 ENCOUNTER — Other Ambulatory Visit: Payer: Self-pay

## 2014-04-30 DIAGNOSIS — G40909 Epilepsy, unspecified, not intractable, without status epilepticus: Secondary | ICD-10-CM

## 2014-04-30 MED ORDER — LAMOTRIGINE 100 MG PO TABS: ORAL_TABLET | ORAL | Status: DC

## 2014-05-19 ENCOUNTER — Encounter: Payer: Self-pay | Admitting: Internal Medicine

## 2014-05-19 ENCOUNTER — Ambulatory Visit (INDEPENDENT_AMBULATORY_CARE_PROVIDER_SITE_OTHER): Payer: BC Managed Care – PPO | Admitting: Internal Medicine

## 2014-05-19 VITALS — BP 120/86 | HR 91 | Temp 98.7°F | Resp 15 | Ht 68.5 in | Wt 170.5 lb

## 2014-05-19 DIAGNOSIS — G40909 Epilepsy, unspecified, not intractable, without status epilepticus: Secondary | ICD-10-CM | POA: Diagnosis not present

## 2014-05-19 DIAGNOSIS — E042 Nontoxic multinodular goiter: Secondary | ICD-10-CM

## 2014-05-19 DIAGNOSIS — E785 Hyperlipidemia, unspecified: Secondary | ICD-10-CM

## 2014-05-19 MED ORDER — LAMOTRIGINE 100 MG PO TABS: ORAL_TABLET | ORAL | Status: DC

## 2014-05-19 NOTE — Assessment & Plan Note (Signed)
NMR Lipoprofile, hepatic panel

## 2014-05-19 NOTE — Progress Notes (Signed)
   Subjective:    Patient ID: Danielle Gamble, female    DOB: 02/15/1960, 54 y.o.   MRN: [id number]  HPI The patient is here to assess status of active health conditions.  PMH, FH, & Social History reviewed & updated.  She is compliant with her statin without adverse effect. She is on a heart healthy diet. She swims 2 times a week for 30 minutes without cardio pulmonary symptoms. She has lost approximately 10 pounds with therapeutic life change.No lipids on file since 2014.  Her seizure disorder has been quiescent. She's only had 2 seizures . The first was in 1995 ; Dilantin was prescribed after the second seizure occurred in 1997. It was at that time that lamictil was recommended by Dr. Sandria ManlyLove.He has retired ;she states his neurology group referred her to me to have the Lamictil long-term.  Dr. Elvera LennoxGherghe is monitoring her multinodular goiter with ultrasound and thyroid function tests. She had a fine needle aspiration procedure last year.   Review of Systems Significant headaches, epistaxis, chest pain, palpitations, exertional dyspnea, claudication, paroxysmal nocturnal dyspnea, or edema absent. No GI symptoms , memory loss or myalgias      Objective:   Physical Exam  Appears healthy and well-nourished & in no acute distress  EOMI w/o lid lag or proptosis  No carotid bruits are present.No neck vein distention present at 10 - 15 degrees. Thyroid firm without definitive nodularity.  Heart rhythm and rate are normal with no gallop or murmur  Chest is clear with no increased work of breathing  There is no evidence of aortic aneurysm or renal artery bruits  Abdomen soft with no organomegaly or masses. No HJR  No clubbing, cyanosis or edema present.  Pedal pulses are intact   No ischemic skin changes are present . Fingernails healthy   Alert and oriented. Strength, tone, DTRs reflexes normal        Assessment & Plan:  See Current Assessment & Plan in Problem List under  specific Diagnosis

## 2014-05-19 NOTE — Assessment & Plan Note (Signed)
[facility] declined to refill Lamictal [facility] continue seizure disorder survelliance  Refill until seen

## 2014-05-19 NOTE — Patient Instructions (Signed)
  Your next office appointment will be determined based upon review of your pending labs Those instructions will be transmitted to you by mail for your records.  Critical results will be called.   Followup as needed for any active or acute issue. Please report any significant change in your symptoms. 

## 2014-05-19 NOTE — Progress Notes (Signed)
Pre visit review using our clinic review tool, if applicable. No additional management support is needed unless otherwise documented below in the visit note. 

## 2014-05-23 ENCOUNTER — Other Ambulatory Visit: Payer: Self-pay | Admitting: Internal Medicine

## 2014-05-23 ENCOUNTER — Other Ambulatory Visit (INDEPENDENT_AMBULATORY_CARE_PROVIDER_SITE_OTHER): Payer: BC Managed Care – PPO

## 2014-05-23 DIAGNOSIS — E785 Hyperlipidemia, unspecified: Secondary | ICD-10-CM | POA: Diagnosis not present

## 2014-05-23 LAB — HEPATIC FUNCTION PANEL
ALBUMIN: 4.5 g/dL (ref 3.5–5.2)
ALK PHOS: 79 U/L (ref 39–117)
ALT: 17 U/L (ref 0–35)
AST: 19 U/L (ref 0–37)
Bilirubin, Direct: 0.1 mg/dL (ref 0.0–0.3)
Total Bilirubin: 0.7 mg/dL (ref 0.2–1.2)
Total Protein: 7.6 g/dL (ref 6.0–8.3)

## 2014-05-27 LAB — NMR LIPOPROFILE WITH LIPIDS
Cholesterol, Total: 200 mg/dL — ABNORMAL HIGH (ref 100–199)
HDL Particle Number: 40.8 umol/L (ref >=30.5)
HDL SIZE: 8.7 nm — AB (ref >=9.2)
HDL-C: 63 mg/dL (ref >39)
LARGE HDL: 5 umol/L (ref >=4.8)
LARGE VLDL-P: 4.1 nmol/L — AB (ref <=2.7)
LDL (calc): 103 mg/dL — ABNORMAL HIGH (ref 0–99)
LDL Particle Number: 1328 nmol/L — ABNORMAL HIGH (ref <1000)
LDL Size: 20.6 nm (ref >=20.8)
LP-IR Score: 66 — ABNORMAL HIGH (ref <=45)
SMALL LDL PARTICLE NUMBER: 555 nmol/L — AB (ref <=527)
Triglycerides: 169 mg/dL — ABNORMAL HIGH (ref 0–149)
VLDL Size: 46.1 nm (ref <=46.6)

## 2014-05-30 ENCOUNTER — Ambulatory Visit (INDEPENDENT_AMBULATORY_CARE_PROVIDER_SITE_OTHER): Payer: BC Managed Care – PPO | Admitting: Neurology

## 2014-05-30 ENCOUNTER — Encounter: Payer: Self-pay | Admitting: Neurology

## 2014-05-30 VITALS — BP 105/60 | HR 86 | Ht 68.5 in | Wt 171.3 lb

## 2014-05-30 DIAGNOSIS — G40909 Epilepsy, unspecified, not intractable, without status epilepticus: Secondary | ICD-10-CM

## 2014-05-30 DIAGNOSIS — G40309 Generalized idiopathic epilepsy and epileptic syndromes, not intractable, without status epilepticus: Secondary | ICD-10-CM

## 2014-05-30 MED ORDER — LAMOTRIGINE 100 MG PO TABS: ORAL_TABLET | ORAL | Status: DC

## 2014-05-30 NOTE — Progress Notes (Signed)
NEUROLOGY CONSULTATION NOTE  Danielle Gamble MRN: [id number] DOB: May 19, 1960  Referring provider: Dr. Marga Melnick Primary care provider: Dr. Marga Melnick  Reason for consult:  Establish care for seizures  Dear Dr Alwyn Ren:  Thank you for your kind referral of Danielle Gamble for consultation of the above symptoms. Although her history is well known to you, please allow me to reiterate it for the purpose of our medical record.Records and images were personally reviewed where available.  HISTORY OF PRESENT ILLNESS: This is a 54 year old right-handed woman with a history of hyperlipidemia and 2 seizures, presenting to establish care for her seizures. She has been a long-time patient of neurologist Dr. Sandria Manly until he retired. She reports that the first seizure occurred at age 80. She was at an event and had been sleep deprived and had not eaten breakfast when she fell and hit the table. She does not recall any warning symptoms, next thing she knew she was sitting up with her tongue bitten. She was not started on medication until a second unprovoked seizure at age 27. She was at a grocery when she recalls feeling faint. She sat down then has no further recollection of events. She was reported have a convulsion. MRI and EEG at that time reported as normal. She was started on Dilantin which she took for at least 10 years, then switched to Lamictal around 2009 due to concern for long-term side effects of Dilantin. She denies any further seizures since age 57. She has been on the same dose of Lamictal  in AM,  in PM since 2009 with no side effects. She denies any gaps in time, staring/unresponsive episodes, no olfactory/gustatory hallucinations, deja vu, rising epigastric sensation, focal numbness/tingling/weakness, myoclonic jerks. She denies any headaches, dizziness, diplopia, dysarthria/dysphagia, neck/back pain, bowel/bladder dysfunction. She drinks alcohol socially, denies any alcohol  intake prior to the previous seizures. She has a history of syncope when ill.   Epilepsy Risk Factors:  She had a normal birth and early development.  There is no history of febrile convulsions, CNS infections such as meningitis/encephalitis, significant traumatic brain injury, neurosurgical procedures, or family history of seizures.  Prior AEDs: Dilantin  PAST MEDICAL HISTORY: Past Medical History  Diagnosis Date  . Seizure disorder     Dr Sandria Manly  . Allergic rhinitis     seasonal  . Hyperlipidemia   . Osteopenia 08/2013    T score -1.7 FRAX 6.9%/0.6%  . Ovarian cyst     PMH of  . Multinodular goiter (nontoxic)     PAST SURGICAL HISTORY: Past Surgical History  Procedure Laterality Date  . Ovarian cyst surgery      done in [place]  . Sternal cystectomy    . Colonoscopy  2012    negative, F/U in 2022([facility])  . Abdominal surgery      Expl. Laparotomy-Right ovar.cyst  . Biopsy thyroid  2015    Dr Elvera Lennox    MEDICATIONS: Current Outpatient Prescriptions on File Prior to Visit  Medication Sig Dispense Refill  . aspirin 81 MG tablet Take 81 mg by mouth daily.      . Calcium Carbonate-Vit D-Min (CALCIUM 1200 PO) Take by mouth daily.      . Cholecalciferol (VITAMIN D3) 1000 UNITS CAPS Take by mouth daily.      Marland Kitchen lamoTRIgine (LAMICTAL) 100 MG tablet 1/2 by mouth in the am, 1 by mouth in the evening 45 tablet 1  . Multiple Vitamin (MULTIVITAMIN) tablet Take 1 tablet  by mouth daily.      . Omega-3 Fatty Acids (FISH OIL PO) Take 1,200 mg by mouth daily.     . simvastatin (ZOCOR) 20 MG tablet TAKE ONE TABLET BY MOUTH NIGHTLY AT BEDTIME  90 tablet 2   No current facility-administered medications on file prior to visit.    ALLERGIES: No Known Allergies  FAMILY HISTORY: Family History  Problem Relation Age of Onset  . Prostate cancer Father   . Heart attack Father [id number]    CBAG; pacer  . Stroke Father [id number]  . Alzheimer's disease Mother   . Coronary artery disease  Paternal Uncle   . Diabetes Neg Hx     SOCIAL HISTORY: History   Social History  . Marital Status: Married    Spouse Name: Danielle Gamble  . Number of Children: 0  . Years of Education: college   Occupational History  .  [employer]   Social History Main Topics  . Smoking status: Never Smoker   . Smokeless tobacco: Never Used  . Alcohol Use: 0.6 oz/week    1 Glasses of wine per week     Comment: socially on weekends  . Drug Use: No  . Sexual Activity: Yes    Birth Control/ Protection: Other-see comments     Comment: VASECTOMY   Other Topics Concern  . Not on file   Social History Narrative   Patient  Lives at home with her husband Danielle Noa(William) [name]    Patient works full time at [employer]   Right handed   Caffeine two cups daily    REVIEW OF SYSTEMS: Constitutional: No fevers, chills, or sweats, no generalized fatigue, change in appetite Eyes: No visual changes, double vision, eye pain Ear, nose and throat: No hearing loss, ear pain, nasal congestion, sore throat Cardiovascular: No chest pain, palpitations Respiratory:  No shortness of breath at rest or with exertion, wheezes GastrointestinaI: No nausea, vomiting, diarrhea, abdominal pain, fecal incontinence Genitourinary:  No dysuria, urinary retention or frequency Musculoskeletal:  No neck pain, back pain Integumentary: No rash, pruritus, skin lesions Neurological: as above Psychiatric: No depression, insomnia, anxiety Endocrine: No palpitations, fatigue, diaphoresis, mood swings, change in appetite, change in weight, increased thirst Hematologic/Lymphatic:  No anemia, purpura, petechiae. Allergic/Immunologic: no itchy/runny eyes, nasal congestion, recent allergic reactions, rashes  PHYSICAL EXAM: Filed Vitals:   05/30/14 0839  BP: 105/60  Pulse: 86   General: No acute distress Head:  Normocephalic/atraumatic Eyes: Fundoscopic exam shows bilateral sharp discs, no vessel changes,  exudates, or hemorrhages Neck: supple, no paraspinal tenderness, full range of motion Back: No paraspinal tenderness Heart: regular rate and rhythm Lungs: Clear to auscultation bilaterally. Vascular: No carotid bruits. Skin/Extremities: No rash, no edema Neurological Exam: Mental status: alert and oriented to person, place, and time, no dysarthria or aphasia, Fund of knowledge is appropriate.  Recent and remote memory are intact.  Attention and concentration are normal.    Able to name objects and repeat phrases. Cranial nerves: CN I: not tested CN II: pupils equal, round and reactive to light, visual fields intact, fundi unremarkable. CN III, IV, VI:  full range of motion, no nystagmus, no ptosis CN V: facial sensation intact CN VII: upper and lower face symmetric CN VIII: hearing intact to finger rub CN IX, X: gag intact, uvula midline CN XI: sternocleidomastoid and trapezius muscles intact CN XII: tongue midline Bulk & Tone: normal, no fasciculations. Motor: 5/5 throughout with no pronator drift. Sensation: intact to light touch, cold,  pin, vibration and joint position sense.  No extinction to double simultaneous stimulation.  Romberg test negative Deep Tendon Reflexes: +2 throughout, no ankle clonus Plantar responses: downgoing bilaterally Cerebellar: no incoordination on finger to nose, heel to shin. No dysdiadochokinesia Gait: narrow-based and steady, able to tandem walk adequately. Tremor: none  IMPRESSION: This is a 54 year old right-handed woman with a history of 2 convulsions in her lifetime. The first was possibly provoked by sleep deprivation, the second was unprovoked. No clear epilepsy risk factors, neurological exam is normal. She has been seizure-free for 18 years, we discussed the option of tapering off Lamictal. We discussed doing an EEG first, then driving restrictions at that point, she should not drive for 6 months after medication taper. She may be interested in  this once she retires, however at this point would like to continue Lamictal 50,100mg  for now. [place] driving laws were discussed with the patient, and she knows to stop driving after a seizure, until 6 months seizure-free. She will follow-up in 1 year or earlier if needed.   Thank you for allowing me to participate in the care of this patient. Please do not hesitate to call for any questions or concerns.   Patrcia Dolly, M.D.  CC: Dr. Alwyn Ren

## 2014-05-30 NOTE — Patient Instructions (Signed)
1. Continue Lamictal 100mg : Take 1/2 tablet in AM, 1 tablet in PM 2. Follow-up in 1 year, call our office for any changes  Seizure Precautions: 1. If medication has been prescribed for you to prevent seizures, take it exactly as directed.  Do not stop taking the medicine without talking to your doctor first, even if you have not had a seizure in a long time.   2. Avoid activities in which a seizure would cause danger to yourself or to others.  Don't operate dangerous machinery, swim alone, or climb in high or dangerous places, such as on ladders, roofs, or girders.  Do not drive unless your doctor says you may.  3. If you have any warning that you may have a seizure, lay down in a safe place where you can't hurt yourself.    4.  No driving for 6 months from last seizure, as [place].   Please refer to the following link on the Epilepsy Foundation of America's website for more information: http://www.epilepsyfoundation.org/answerplace/Social/driving/drivingu.cfm   5.  Maintain good sleep hygiene.  6.  Contact your doctor if you have any problems that may be related to the medicine you are taking.  7.  Call 911 and bring the patient back to the ED if:        A.  The seizure lasts longer than 5 minutes.       B.  The patient doesn't awaken shortly after the seizure  C.  The patient has new problems such as difficulty seeing, speaking or moving  D.  The patient was injured during the seizure  E.  The patient has a temperature over 102 F (39C)  F.  The patient vomited and now is having trouble breathing

## 2014-06-04 DIAGNOSIS — G40309 Generalized idiopathic epilepsy and epileptic syndromes, not intractable, without status epilepticus: Secondary | ICD-10-CM | POA: Insufficient documentation

## 2014-09-04 ENCOUNTER — Telehealth: Payer: Self-pay | Admitting: *Deleted

## 2014-09-04 MED ORDER — SIMVASTATIN 20 MG PO TABS: 20.0000 mg | ORAL_TABLET | Freq: Every day | ORAL | Status: DC

## 2014-09-04 NOTE — Telephone Encounter (Signed)
Left msg on triage stating we have sent request twice for refill on pt simvastatin. Calling back for status. Per chart no refill request has came through, sent rx electronically...Raechel Chute

## 2014-09-10 ENCOUNTER — Other Ambulatory Visit (INDEPENDENT_AMBULATORY_CARE_PROVIDER_SITE_OTHER): Payer: BC Managed Care – PPO

## 2014-09-10 ENCOUNTER — Encounter: Payer: Self-pay | Admitting: Internal Medicine

## 2014-09-10 ENCOUNTER — Ambulatory Visit (INDEPENDENT_AMBULATORY_CARE_PROVIDER_SITE_OTHER): Payer: BC Managed Care – PPO | Admitting: Internal Medicine

## 2014-09-10 VITALS — BP 118/82 | HR 95 | Temp 99.2°F | Resp 16 | Wt 170.0 lb

## 2014-09-10 DIAGNOSIS — R000 Tachycardia, unspecified: Secondary | ICD-10-CM | POA: Diagnosis not present

## 2014-09-10 DIAGNOSIS — R42 Dizziness and giddiness: Secondary | ICD-10-CM | POA: Diagnosis not present

## 2014-09-10 DIAGNOSIS — E785 Hyperlipidemia, unspecified: Secondary | ICD-10-CM | POA: Diagnosis not present

## 2014-09-10 LAB — T3, FREE: T3 FREE: 3.1 pg/mL (ref 2.3–4.2)

## 2014-09-10 LAB — T4, FREE: Free T4: 0.78 ng/dL (ref 0.60–1.60)

## 2014-09-10 LAB — MAGNESIUM: Magnesium: 2 mg/dL (ref 1.5–2.5)

## 2014-09-10 LAB — TSH: TSH: 1.36 u[IU]/mL (ref 0.35–4.50)

## 2014-09-10 MED ORDER — SIMVASTATIN 20 MG PO TABS: 20.0000 mg | ORAL_TABLET | Freq: Every day | ORAL | Status: DC

## 2014-09-10 NOTE — Progress Notes (Signed)
   Subjective:    Patient ID: Danielle Gamble, female    DOB: 03/28/1960, 54 y.o.   MRN: [id number]  HPI   On 08/25/14 she had gone to a grocery store and had been shopping for approximately 10 minutes when she became profoundly lightheaded. She had been in significant heat prior to entering the store. Additionally she slept poorly the night before. She had minimal breakfast the day of the event. She felt lightheaded and had to sit down. There was no loss of consciousness or seizure activity. Prior to the symptoms she did notice tachycardia.  Chemistries and blood count from the urgent care were reviewed. Potassium was minimally reduced at 3.7.  She states she had been under stress as she was at her mother's home preparing to move her into a facility. Her mother has advanced Alzheimer's.  She's also concerned as her father had significant heart disease and eventually had a pacer. He also bypass surgery.  She's requesting a refill on her statin. Advanced lipid testing in April this year was excellent.  Review of Systems  Denied pre event were associated chest pain or shortness of breath .  Also specifically denied prior to the episode were headache, limb weakness, tingling, or numbness. No seizure activity noted.  Since the event she denies any constellation of paroxysmal tachycardia, chest pain, headache, flushing or diarrhea.     Objective:   Physical Exam  Pertinent or positive findings include:Neurologic exam : Cn 2-7 intact Strength equal & normal in upper & lower extremities Able to walk on heels and toes.   Balance normal  Romberg normal, finger to nose normal. Deep tendon reflexes are normal.  General appearance :adequately nourished; in no distress.  Eyes: No conjunctival inflammation or scleral icterus is present. Extraocular motion intact without nystagmus. No lid lag noted.  Oral exam:  Lips and gums are healthy appearing.There is no oropharyngeal erythema or exudate  noted. Dental hygiene is good.  Heart:  Slightly elevated rate and regular rhythm. S1 and S2 normal without gallop, murmur, click, rub or other extra sounds    Lungs:Chest clear to auscultation; no wheezes, rhonchi,rales ,or rubs present.No increased work of breathing.   Abdomen: bowel sounds normal, soft and non-tender without masses, organomegaly or hernias noted.  No guarding or rebound.   Vascular : all pulses equal ; no bruits present.  Skin:Warm & dry.  Intact without suspicious lesions or rashes ; no tenting or jaundice   Lymphatic: No lymphadenopathy is noted about the head, neck, axilla         Assessment & Plan:  #1 episodic lightheadedness preceded by tachycardia  #2 slight hypokalemia  Plan: See orders and recommendations. If symptoms recur she will need either a 24-hour or 30 day Holter monitor.

## 2014-09-10 NOTE — Progress Notes (Signed)
Pre visit review using our clinic review tool, if applicable. No additional management support is needed unless otherwise documented below in the visit note. 

## 2014-09-10 NOTE — Patient Instructions (Signed)
To increase  Potassium (K+) increase citrus fruits & bananas in diet and use the salt substitute No Salt, which contains  potassium , to season food @ the table. Recheck K+ after 4 weeks . Hopp To prevent palpitations ,tachycardia, or premature beats, avoid stimulants such as decongestants, diet pills, nicotine, or caffeine (coffee, tea, cola, or chocolate) to excess.

## 2014-09-19 ENCOUNTER — Encounter: Payer: Self-pay | Admitting: Gynecology

## 2014-10-08 ENCOUNTER — Encounter: Payer: Self-pay | Admitting: Gynecology

## 2014-10-08 ENCOUNTER — Ambulatory Visit (INDEPENDENT_AMBULATORY_CARE_PROVIDER_SITE_OTHER): Payer: BC Managed Care – PPO | Admitting: Gynecology

## 2014-10-08 VITALS — BP 124/80 | Ht 68.0 in | Wt 171.0 lb

## 2014-10-08 DIAGNOSIS — Z01419 Encounter for gynecological examination (general) (routine) without abnormal findings: Secondary | ICD-10-CM | POA: Diagnosis not present

## 2014-10-08 DIAGNOSIS — M8580 Other specified disorders of bone density and structure, unspecified site: Secondary | ICD-10-CM

## 2014-10-08 NOTE — Progress Notes (Signed)
Danielle Gamble Aug 01, 1960 [id number]        54 y.o.  [name] for annual exam.  Doing well without complaints.  Past medical history,surgical history, problem list, medications, allergies, family history and social history were all reviewed and documented as reviewed in the EPIC chart.  ROS:  Performed with pertinent positives and negatives included in the history, assessment and plan.   Additional significant findings :  none   Exam: Delena Serve Vitals:   10/08/14 1539  BP: 124/80  Height:  (1.727 m)  Weight: 171 lb (77.565 kg)   General appearance:  Normal affect, orientation and appearance. Skin: Grossly normal HEENT: Without gross lesions.  No cervical or supraclavicular adenopathy. Thyroid normal.  Lungs:  Clear without wheezing, rales or rhonchi Cardiac: RR, without RMG Abdominal:  Soft, nontender, without masses, guarding, rebound, organomegaly or hernia Breasts:  Examined lying and sitting without masses, retractions, discharge or axillary adenopathy. Pelvic:  Ext/BUS/vagina normal  Cervix normal  Uterus anteverted, normal size, shape and contour, midline and mobile nontender   Adnexa  Without masses or tenderness    Anus and perineum  Normal   Rectovaginal  Normal sphincter tone without palpated masses or tenderness.    Assessment/Plan:  54 y.o. [name] female for annual exam.   1. Postmenopausal. Without significant symptoms of hot flashes, night sweats, vaginal dryness. No vaginal bleeding. Continue to monitor and report any vaginal bleeding. 2. Osteopenia. DEXA 08/2013 T score -1.7 FRAX 6.9%/0.6%. Plan repeat DEXA next year to year interval. Increase calcium vitamin D reviewed. 3. Mammography 09/2014. Continue with annual mammography. SBE monthly reviewed. 4. Pap smear/HPV negative 08/2013. The Pap smear done today. No history of significant abnormal Pap smears. Plan repeat Pap smear at 3-5 year interval. 5. Colonoscopy reported 2012 with  recommended repeat interval 10 years. 6. Health maintenance. No routine blood work done as patient has this done at her primary physician's office. Follow up 1 year, sooner as needed.   [name], 4:07 PM 10/08/2014

## 2014-10-08 NOTE — Patient Instructions (Signed)

## 2014-10-08 NOTE — Addendum Note (Signed)
Addended by: Kem Parkinson on: 10/08/2014 04:29 PM   Modules accepted: Orders

## 2015-01-15 ENCOUNTER — Telehealth: Payer: Self-pay

## 2015-01-15 NOTE — Telephone Encounter (Signed)
Left Voice Mail for pt to call back.   RE: Flu Vaccine for 2016  

## 2015-06-10 ENCOUNTER — Other Ambulatory Visit: Payer: Self-pay | Admitting: Neurology

## 2015-07-14 ENCOUNTER — Telehealth: Payer: Self-pay | Admitting: *Deleted

## 2015-07-14 DIAGNOSIS — E785 Hyperlipidemia, unspecified: Secondary | ICD-10-CM

## 2015-07-29 MED ORDER — SIMVASTATIN 20 MG PO TABS: 20.0000 mg | ORAL_TABLET | Freq: Every day | ORAL | Status: DC

## 2015-07-29 NOTE — Telephone Encounter (Signed)
Patient scheduled an appt for 11/13/2015. Please fill medication up until that point.

## 2015-07-29 NOTE — Telephone Encounter (Signed)
[facility]atin sent to [facility]

## 2015-07-29 NOTE — Addendum Note (Signed)
Addended by: Alonna MiniumWILLIAMS, Pierre Cumpton P on: 07/29/2015 11:00 AM   Modules accepted: Orders

## 2015-08-16 ENCOUNTER — Other Ambulatory Visit: Payer: Self-pay | Admitting: Neurology

## 2015-08-17 ENCOUNTER — Telehealth: Payer: Self-pay | Admitting: Neurology

## 2015-08-17 MED ORDER — LAMOTRIGINE 100 MG PO TABS: ORAL_TABLET | ORAL | Status: DC

## 2015-08-17 NOTE — Telephone Encounter (Signed)
Spoke to patient - she is aware that we still see stable patients yearly, especially if we are prescribing medications.  She has scheduled a follow up appt for 09/08/15 and refills have been sent to the pharmacy to get her through until this date.  Her PCP has previously been providing refills.

## 2015-08-17 NOTE — Addendum Note (Signed)
Addended by: Lindell SparKIRKMAN, Horrace Hanak C on: 08/17/2015 11:18 AM   Modules accepted: Orders

## 2015-08-17 NOTE — Telephone Encounter (Signed)
Patient is calling and states she is almost out of her Rx lamotrigine 100 mg tablets.  She said when she was last in to see Dr. Terrace ArabiaYan that Dr. Terrace ArabiaYan told her since she had been taking the medication for 20 years successfully she would not need to come into the office to get this refilled.  She is going out of town tomorrow and at least needs a 1 month supply to get her through called to [facility] [id number] on [address] please. She will be glad to make an appointment if she needs to. ThankdS

## 2015-09-08 ENCOUNTER — Ambulatory Visit (INDEPENDENT_AMBULATORY_CARE_PROVIDER_SITE_OTHER): Payer: BC Managed Care – PPO | Admitting: Adult Health

## 2015-09-08 ENCOUNTER — Encounter: Payer: Self-pay | Admitting: Adult Health

## 2015-09-08 VITALS — BP 125/79 | HR 81 | Ht 68.0 in | Wt 176.5 lb

## 2015-09-08 DIAGNOSIS — R569 Unspecified convulsions: Secondary | ICD-10-CM

## 2015-09-08 MED ORDER — LAMOTRIGINE 100 MG PO TABS: ORAL_TABLET | ORAL | 3 refills | Status: DC

## 2015-09-08 NOTE — Patient Instructions (Signed)
Continue lamictal  Blood work today If you have any seizure events please let us know.   

## 2015-09-08 NOTE — Progress Notes (Signed)
PATIENT: Danielle Gamble DOB: Aug 13, 1960  REASON FOR VISIT: follow up- seizures HISTORY FROM: patient  HISTORY OF PRESENT ILLNESS: Ms. Danielle Gamble is a 55 year old female with a history of seizures. She returns today for follow-up. She is on Lamictal on tolerating it well. Denies any seizure events. Able to complete all ADLs and family. 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 Denies any changes in her gait or balance. She states her primary care recently retired and she has an appointment with her new PCP and October. At that visit she will have her blood work checked. She returns today for an evaluation.  HISTORY 04/16/13 [facility]): Danielle Gamble is a 55 years old right-handed Caucasian female, previously patient of Dr. love, last clinical visit was January 2014,  She had a history of seizure, first one was in 1995, no warning signs, generalized tonic-clonic seizure, with tongue biting, incontinence, a second seizure was in 1997, generalized, mild body shaking,  MRI of the brain in 1997 was normal, EEG was normal,  She was initially treated with Dilantin, has been on current dose of lamotrigine since 2011, 100 mg tablet, half tablet in the morning, 1 tablet every night, no side effect, doing very well, still driving, [place] full [place], coming in to refill her medications,   REVIEW OF SYSTEMS: Out of a complete 14 system review of symptoms, the patient complains only of the following symptoms, and all other reviewed systems are negative.  See history of present illness  ALLERGIES: No Known Allergies  HOME MEDICATIONS: Outpatient Medications Prior to Visit  Medication Sig Dispense Refill  . aspirin 81 MG tablet Take 81 mg by mouth daily.      . Calcium Carbonate-Vit D-Min (CALCIUM 1200 PO) Take by mouth daily.      . Cholecalciferol (VITAMIN D3) 1000 UNITS CAPS Take by mouth daily.      . Multiple Vitamin (MULTIVITAMIN) tablet Take 1 tablet by mouth  daily.      . simvastatin (ZOCOR) 20 MG tablet Take 1 tablet (20 mg total) by mouth at bedtime. 90 tablet 1  . lamoTRIgine (LAMICTAL) 100 MG tablet TAKE 1/2 BY MOUTH IN THE AM, 1 BY MOUTH IN THE EVENING. 45 tablet 1  . Omega-3 Fatty Acids (FISH OIL PO) Take 1,200 mg by mouth daily.      No facility-administered medications prior to visit.     PAST MEDICAL HISTORY: Past Medical History:  Diagnosis Date  . Allergic rhinitis    seasonal  . Hyperlipidemia   . Multinodular goiter (nontoxic)   . Osteopenia 08/2013   T score -1.7 FRAX 6.9%/0.6%  . Ovarian cyst    PMH of  . Seizure disorder (HCC)    Dr Sandria ManlyLove    PAST SURGICAL HISTORY: Past Surgical History:  Procedure Laterality Date  . ABDOMINAL SURGERY     Expl. Laparotomy-Right ovar.cyst  . BIOPSY THYROID  2015   Dr Elvera LennoxGherghe  . COLONOSCOPY  2012   negative, F/U in 2022([facility])  . O[facility]IAN [facility]ST [facility]     [facility]  . Sternal Cystectomy      FAMILY HISTORY: Family History  Problem Relation Age of Onset  . Prostate cancer Father   . Heart attack Father [id number]    CBAG; pacer  . Stroke Father [id number]  . Alzheimer's disease Mother   . Coronary artery disease Paternal Uncle   . Diabetes Neg Hx     SOCIAL HISTORY:  Social History   Social History  . Marital status: Married    Spouse name: [name]  . Number of children: 0  . Years of education: college   Occupational History  .  [employer]   Social History Main Topics  . Smoking status: Never Smoker  . Smokeless tobacco: Never Used  . Alcohol use 0.6 oz/week    1 Glasses of wine per week     Comment: socially on weekends  . Drug use: No  . Sexual activity: Yes    Birth control/ protection: Other-see comments     Comment: VASECTOMY   Other Topics Concern  . Not on file   Social History Narrative   Patient  Lives at home with her husband [name]) [name]    Patient works full time at [employer]   Right handed   Caffeine  two cups daily      PHYSICAL EXAM  Vitals:   09/08/15 0933  BP: 125/79  Pulse: 81  Weight: 176 lb 8 oz (80.1 kg)  Height:  (1.727 m)   Body mass index is 26.84 kg/m.  Generalized: Well developed, in no acute distress   Neurological examination  Mentation: Alert oriented to time, place, history taking. Follows all commands speech and language fluent Cranial nerve II-XII: Pupils were equal round reactive to light. Extraocular movements were full, visual field were full on confrontational test. Facial sensation and strength were normal. Uvula tongue midline. Head turning and shoulder shrug  were normal and symmetric. Motor: The motor testing reveals 5 over 5 strength of all 4 extremities. Good symmetric motor tone is noted throughout.  Sensory: Sensory testing is intact to soft touch on all 4 extremities. No evidence of extinction is noted.  Coordination: Cerebellar testing reveals good finger-nose-finger and heel-to-shin bilaterally.  Gait and station: Gait is normal. Tandem gait is normal. Romberg is negative. No drift is seen.  Reflexes: Deep tendon reflexes are symmetric and normal bilaterally.   DIAGNOSTIC DATA (LABS, IMAGING, TESTING) - I reviewed patient records, labs, notes, testing and imaging myself where available.   ASSESSMENT AND PLAN 55 y.o. year old female  has a past medical history of Allergic rhinitis; Hyperlipidemia; Multinodular goiter (nontoxic); Osteopenia (08/2013); Ovarian cyst; and Seizure disorder (HCC). here with:  1. Seizures  Overall the patient has remained stable. She will continue on Lamictal- refill sent in today. She will have blood work with her primary care provider. Advised that if her new primary care is amenable to managing her Lamictal she can follow up there for refills. If not she will follow-up with our office in 1 year or if symptoms worsen.   Butch Penny, MSN, NP-C 09/08/2015, 9:57 AM [facility] [address] [place], [place] [address] [phone]

## 2015-09-19 ENCOUNTER — Encounter: Payer: Self-pay | Admitting: Gynecology

## 2015-09-26 NOTE — Progress Notes (Signed)
I reviewed note and agree with plan.   Dartanyon Frankowski R. Tye Juarez, MD  Certified in Neurology, Neurophysiology and Neuroimaging  [facility] [address], [address] [place], [place] [address] [phone]   

## 2015-10-15 ENCOUNTER — Ambulatory Visit (INDEPENDENT_AMBULATORY_CARE_PROVIDER_SITE_OTHER): Payer: BC Managed Care – PPO | Admitting: Gynecology

## 2015-10-15 ENCOUNTER — Encounter: Payer: Self-pay | Admitting: Gynecology

## 2015-10-15 VITALS — BP 120/76 | Ht 69.0 in | Wt 176.0 lb

## 2015-10-15 DIAGNOSIS — M8580 Other specified disorders of bone density and structure, unspecified site: Secondary | ICD-10-CM

## 2015-10-15 DIAGNOSIS — Z01419 Encounter for gynecological examination (general) (routine) without abnormal findings: Secondary | ICD-10-CM | POA: Diagnosis not present

## 2015-10-15 DIAGNOSIS — N952 Postmenopausal atrophic vaginitis: Secondary | ICD-10-CM | POA: Diagnosis not present

## 2015-10-15 NOTE — Patient Instructions (Signed)

## 2015-10-15 NOTE — Progress Notes (Signed)
    Danielle LemonsRoberta R Gamble 1960-09-11 [id number]        55 y.o.  [name]  for annual exam.  Doing well without complaints.  Past medical history,surgical history, problem list, medications, allergies, family history and social history were all reviewed and documented as reviewed in the EPIC chart.  ROS:  Performed with pertinent positives and negatives included in the history, assessment and plan.   Additional significant findings :  None   Exam: Kennon PortelaKim Gardner assistant Vitals:   10/15/15 1548  BP: 120/76  Weight: 176 lb (79.8 kg)  Height: 5\' 9"  (1.753 m)   Body mass index is 25.99 kg/m.  General appearance:  Normal affect, orientation and appearance. Skin: Grossly normal HEENT: Without gross lesions.  No cervical or supraclavicular adenopathy. Thyroid normal.  Lungs:  Clear without wheezing, rales or rhonchi Cardiac: RR, without RMG Abdominal:  Soft, nontender, without masses, guarding, rebound, organomegaly or hernia Breasts:  Examined lying and sitting without masses, retractions, discharge or axillary adenopathy. Pelvic:  Ext/BUS/Vagina with atrophic changes  Cervix with atrophic changes  Uterus anteverted, normal size, shape and contour, midline and mobile nontender   Adnexa without masses or tenderness    Anus and perineum normal   Rectovaginal normal sphincter tone without palpated masses or tenderness.    Assessment/Plan:  55 y.o. [name] female for annual exam.   1. post menopausal/atrophic genital changes. No significant hot flushes, night sweats, vaginal dryness or any vaginal bleeding. Continue to monitor report any issues or vaginal bleeding. 2. Osteopenia. DEXA 08/2013 T score -1.7 FRAX 6.9%/0.6%. Recommended patient call [facility] and schedule now at 2 year interval and she agrees to do so. Increased calcium vitamin D. 3. Pap smear/HPV 08/2013 negative. No Pap smear done today. No history of significant abnormal Pap smears. Plan repeat Pap smear at 5 year interval per  current screening guidelines. 4. Mammography 09/2015. Continue with annual mammography when due. SBE monthly reviewed. 5. Colonoscopy 5 years with reported repeat interval 10 years. 6. Health maintenance. No routine lab work done as patient does this elsewhere. Follow up 1 year, sooner as needed.   Dara LordsFONTAINE,Danielle Gamble P MD, 4:06 PM 10/15/2015

## 2015-11-13 ENCOUNTER — Ambulatory Visit (INDEPENDENT_AMBULATORY_CARE_PROVIDER_SITE_OTHER): Payer: BC Managed Care – PPO | Admitting: Internal Medicine

## 2015-11-13 ENCOUNTER — Encounter: Payer: Self-pay | Admitting: Internal Medicine

## 2015-11-13 VITALS — BP 128/86 | HR 84 | Temp 98.7°F | Resp 16 | Ht 69.0 in | Wt 176.0 lb

## 2015-11-13 DIAGNOSIS — Z0000 Encounter for general adult medical examination without abnormal findings: Secondary | ICD-10-CM

## 2015-11-13 DIAGNOSIS — E785 Hyperlipidemia, unspecified: Secondary | ICD-10-CM

## 2015-11-13 DIAGNOSIS — Z1283 Encounter for screening for malignant neoplasm of skin: Secondary | ICD-10-CM | POA: Diagnosis not present

## 2015-11-13 DIAGNOSIS — G40309 Generalized idiopathic epilepsy and epileptic syndromes, not intractable, without status epilepticus: Secondary | ICD-10-CM

## 2015-11-13 DIAGNOSIS — Z1159 Encounter for screening for other viral diseases: Secondary | ICD-10-CM

## 2015-11-13 DIAGNOSIS — Z23 Encounter for immunization: Secondary | ICD-10-CM | POA: Diagnosis not present

## 2015-11-13 MED ORDER — SIMVASTATIN 20 MG PO TABS
20.0000 mg | ORAL_TABLET | Freq: Every day | ORAL | 3 refills | Status: DC
Start: 2015-11-13 — End: 2016-11-24

## 2015-11-13 NOTE — Patient Instructions (Signed)
Test(s) ordered today. Your results will be released to [place] (or called to you) after review, usually within 72hours after test completion. If any changes need to be made, you will be notified at that same time.  All other Health Maintenance issues reviewed.   All recommended immunizations and age-appropriate screenings are up-to-date or discussed.  Flu vaccine administered today.   Medications reviewed and updated.  No changes recommended at this time.  Your prescription(s) have been submitted to your pharmacy. Please take as directed and contact our office if you believe you are having problem(s) with the medication(s).  A referral was ordered for derm  Please followup in one year for a physical   Health Maintenance, Female Adopting a healthy lifestyle and getting preventive care can go a long way to promote health and wellness. Talk with your health care provider about what schedule of regular examinations is right for you. This is a good chance for you to check in with your provider about disease prevention and staying healthy. In between checkups, there are plenty of things you can do on your own. Experts have done a lot of research about which lifestyle changes and preventive measures are most likely to keep you healthy. Ask your health care provider for more information. WEIGHT AND DIET  Eat a healthy diet  Be sure to include plenty of vegetables, fruits, low-fat dairy products, and lean protein.  Do not eat a lot of foods high in solid fats, added sugars, or salt.  Get regular exercise. This is one of the most important things you can do for your health.  Most adults should exercise for at least 150 minutes each week. The exercise should increase your heart rate and make you sweat (moderate-intensity exercise).  Most adults should also do strengthening exercises at least twice a week. This is in addition to the moderate-intensity exercise.  Maintain a healthy weight  Body  mass index (BMI) is a measurement that can be used to identify possible weight problems. It estimates body fat based on height and weight. Your health care provider can help determine your BMI and help you achieve or maintain a healthy weight.  For females 30 years of age and older:   A BMI below 18.5 is considered underweight.  A BMI of 18.5 to 24.9 is normal.  A BMI of 25 to 29.9 is considered overweight.  A BMI of 30 and above is considered obese.  Watch levels of cholesterol and blood lipids  You should start having your blood tested for lipids and cholesterol at 57 years of age, then have this test every 5 years.  You may need to have your cholesterol levels checked more often if:  Your lipid or cholesterol levels are high.  You are older than 57 years of age.  You are at high risk for heart disease.  CANCER SCREENING   Lung Cancer  Lung cancer screening is recommended for adults 31-65 years old who are at high risk for lung cancer because of a history of smoking.  A yearly low-dose CT scan of the lungs is recommended for people who:  Currently smoke.  Have quit within the past 15 years.  Have at least a 30-pack-year history of smoking. A pack year is smoking an average of one pack of cigarettes a day for 1 year.  Yearly screening should continue until it has been 15 years since you quit.  Yearly screening should stop if you develop a health problem that would prevent  you from having lung cancer treatment.  Breast Cancer  Practice breast self-awareness. This means understanding how your breasts normally appear and feel.  It also means doing regular breast self-exams. Let your health care provider know about any changes, no matter how small.  If you are in your 20s or 30s, you should have a clinical breast exam (CBE) by a health care provider every 1-3 years as part of a regular health exam.  If you are 61 or older, have a CBE every year. Also consider having a  breast X-ray (mammogram) every year.  If you have a family history of breast cancer, talk to your health care provider about genetic screening.  If you are at high risk for breast cancer, talk to your health care provider about having an MRI and a mammogram every year.  Breast cancer gene (BRCA) assessment is recommended for women who have family members with BRCA-related cancers. BRCA-related cancers include:  Breast.  Ovarian.  Tubal.  Peritoneal cancers.  Results of the assessment will determine the need for genetic counseling and BRCA1 and BRCA2 testing. Cervical Cancer Your health care provider may recommend that you be screened regularly for cancer of the pelvic organs (ovaries, uterus, and vagina). This screening involves a pelvic examination, including checking for microscopic changes to the surface of your cervix (Pap test). You may be encouraged to have this screening done every 3 years, beginning at age 67.  For women ages 37-65, health care providers may recommend pelvic exams and Pap testing every 3 years, or they may recommend the Pap and pelvic exam, combined with testing for human papilloma virus (HPV), every 5 years. Some types of HPV increase your risk of cervical cancer. Testing for HPV may also be done on women of any age with unclear Pap test results.  Other health care providers may not recommend any screening for nonpregnant women who are considered low risk for pelvic cancer and who do not have symptoms. Ask your health care provider if a screening pelvic exam is right for you.  If you have had past treatment for cervical cancer or a condition that could lead to cancer, you need Pap tests and screening for cancer for at least 20 years after your treatment. If Pap tests have been discontinued, your risk factors (such as having a new sexual partner) need to be reassessed to determine if screening should resume. Some women have medical problems that increase the chance of  getting cervical cancer. In these cases, your health care provider may recommend more frequent screening and Pap tests. Colorectal Cancer  This type of cancer can be detected and often prevented.  Routine colorectal cancer screening usually begins at 57 years of age and continues through 57 years of age.  Your health care provider may recommend screening at an earlier age if you have risk factors for colon cancer.  Your health care provider may also recommend using home test kits to check for hidden blood in the stool.  A small camera at the end of a tube can be used to examine your colon directly (sigmoidoscopy or colonoscopy). This is done to check for the earliest forms of colorectal cancer.  Routine screening usually begins at age 46.  Direct examination of the colon should be repeated every 5-10 years through 57 years of age. However, you may need to be screened more often if early forms of precancerous polyps or small growths are found. Skin Cancer  Check your skin from head to  toe regularly.  Tell your health care provider about any new moles or changes in moles, especially if there is a change in a mole's shape or color.  Also tell your health care provider if you have a mole that is larger than the size of a pencil eraser.  Always use sunscreen. Apply sunscreen liberally and repeatedly throughout the day.  Protect yourself by wearing long sleeves, pants, a wide-brimmed hat, and sunglasses whenever you are outside. HEART DISEASE, DIABETES, AND HIGH BLOOD PRESSURE   High blood pressure causes heart disease and increases the risk of stroke. High blood pressure is more likely to develop in:  People who have blood pressure in the high end of the normal range (130-139/85-89 mm Hg).  People who are overweight or obese.  People who are African American.  If you are 77-82 years of age, have your blood pressure checked every 3-5 years. If you are 29 years of age or older, have  your blood pressure checked every year. You should have your blood pressure measured twice--once when you are at a hospital or clinic, and once when you are not at a hospital or clinic. Record the average of the two measurements. To check your blood pressure when you are not at a hospital or clinic, you can use:  An automated blood pressure machine at a pharmacy.  A home blood pressure monitor.  If you are between 26 years and 8 years old, ask your health care provider if you should take aspirin to prevent strokes.  Have regular diabetes screenings. This involves taking a blood sample to check your fasting blood sugar level.  If you are at a normal weight and have a low risk for diabetes, have this test once every three years after 57 years of age.  If you are overweight and have a high risk for diabetes, consider being tested at a younger age or more often. PREVENTING INFECTION  Hepatitis B  If you have a higher risk for hepatitis B, you should be screened for this virus. You are considered at high risk for hepatitis B if:  You were born in a country where hepatitis B is common. Ask your health care provider which countries are considered high risk.  Your parents were born in a high-risk country, and you have not been immunized against hepatitis B (hepatitis B vaccine).  You have HIV or AIDS.  You use needles to inject street drugs.  You live with someone who has hepatitis B.  You have had sex with someone who has hepatitis B.  You get hemodialysis treatment.  You take certain medicines for conditions, including cancer, organ transplantation, and autoimmune conditions. Hepatitis C  Blood testing is recommended for:  Everyone born from [birth year] through 1967.  Anyone with known risk factors for hepatitis C. Sexually transmitted infections (STIs)  You should be screened for sexually transmitted infections (STIs) including gonorrhea and chlamydia if:  You are sexually active and  are younger than 57 years of age.  You are older than 57 years of age and your health care provider tells you that you are at risk for this type of infection.  Your sexual activity has changed since you were last screened and you are at an increased risk for chlamydia or gonorrhea. Ask your health care provider if you are at risk.  If you do not have HIV, but are at risk, it may be recommended that you take a prescription medicine daily to prevent HIV infection. This  is called pre-exposure prophylaxis (PrEP). You are considered at risk if:  You are sexually active and do not regularly use condoms or know the HIV status of your partner(s).  You take drugs by injection.  You are sexually active with a partner who has HIV. Talk with your health care provider about whether you are at high risk of being infected with HIV. If you choose to begin PrEP, you should first be tested for HIV. You should then be tested every 3 months for as long as you are taking PrEP.  PREGNANCY   If you are premenopausal and you may become pregnant, ask your health care provider about preconception counseling.  If you may become pregnant, take 400 to 800 micrograms (mcg) of folic acid every day.  If you want to prevent pregnancy, talk to your health care provider about birth control (contraception). OSTEOPOROSIS AND MENOPAUSE   Osteoporosis is a disease in which the bones lose minerals and strength with aging. This can result in serious bone fractures. Your risk for osteoporosis can be identified using a bone density scan.  If you are 88 years of age or older, or if you are at risk for osteoporosis and fractures, ask your health care provider if you should be screened.  Ask your health care provider whether you should take a calcium or vitamin D supplement to lower your risk for osteoporosis.  Menopause may have certain physical symptoms and risks.  Hormone replacement therapy may reduce some of these symptoms  and risks. Talk to your health care provider about whether hormone replacement therapy is right for you.  HOME CARE INSTRUCTIONS   Schedule regular health, dental, and eye exams.  Stay current with your immunizations.   Do not use any tobacco products including cigarettes, chewing tobacco, or electronic cigarettes.  If you are pregnant, do not drink alcohol.  If you are breastfeeding, limit how much and how often you drink alcohol.  Limit alcohol intake to no more than 1 drink per day for nonpregnant women. One drink equals 12 ounces of beer, 5 ounces of wine, or 1 ounces of hard liquor.  Do not use street drugs.  Do not share needles.  Ask your health care provider for help if you need support or information about quitting drugs.  Tell your health care provider if you often feel depressed.  Tell your health care provider if you have ever been abused or do not feel safe at home.   This information is not intended to replace advice given to you by your health care provider. Make sure you discuss any questions you have with your health care provider.   Document Released: 08/02/2010 Document Revised: 02/07/2014 Document Reviewed: 12/19/2012 Elsevier Interactive Patient Education [employer].

## 2015-11-13 NOTE — Progress Notes (Signed)
Pre visit review using our clinic review tool, if applicable. No additional management support is needed unless otherwise documented below in the visit note. 

## 2015-11-13 NOTE — Progress Notes (Signed)
Subjective:    Patient ID: Danielle LemonsRoberta R Gamble, female    DOB: 10-Dec-1960, 55 y.o.   MRN: [id number]  HPI  She is here to establish with a new pcp.  She is here for a physical exam.   She denies any changes in her health or family health since last year.   She has no concerns.  She knows she needs to increase exercise and work on weight loss.  Her mom has dementia and she is spending a lot of time with her.   Medications and allergies reviewed with patient and updated if appropriate.  Patient Active Problem List   Diagnosis Date Noted  . 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 Callus of foot 09/12/2012  . Onychomycosis 09/12/2012  . 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 Porokeratosis 09/12/2012  . Fissure in skin of foot 09/12/2012  . Multinodular goiter 11/29/2011  . Ovarian cyst   . Osteopenia 09/10/2010  . Hyperlipidemia 09/01/2009  . ALLERGIC RHINITIS 06/07/2008  . Seizure disorder (HCC) 06/07/2008    Current Outpatient Prescriptions on File Prior to Visit  Medication Sig Dispense Refill  . aspirin 81 MG tablet Take 81 mg by mouth daily.      . Calcium Carbonate-Vit D-Min (CALCIUM 1200 PO) Take by mouth daily.      Marland Kitchen. lamoTRIgine (LAMICTAL) 100 MG tablet TAKE 1/2 BY MOUTH IN THE AM, 1 BY MOUTH IN THE EVENING. 135 tablet 3  . Multiple Vitamin (MULTIVITAMIN) tablet Take 1 tablet by mouth daily.      . simvastatin (ZOCOR) 20 MG tablet Take 1 tablet (20 mg total) by mouth at bedtime. 90 tablet 1   No current facility-administered medications on file prior to visit.     Past Medical History:  Diagnosis Date  . Allergic rhinitis    seasonal  . Hyperlipidemia   . Multinodular goiter (nontoxic)   . Osteopenia 08/2013   T score -1.7 FRAX 6.9%/0.6%  . Ovarian cyst    PMH of  . Seizure disorder [facility](HCC)    Dr Sandria ManlyLove    Past Surgical History:  Procedure Laterality Date  . ABDOMINAL SURGERY     Expl. Laparotomy-Right ovar.cyst  . BIOPSY THYROID  2015   Dr  Elvera LennoxGherghe  . COLONOSCOPY  2012   negative, F/U in 2022([facility])  . OVARIAN CYST SURGERY     [place] [facility]  . Sternal Cystectomy      Social History   Social History  . Marital status: Married    Spouse name: Chrissie NoaWilliam  . Number of children: 0  . Years of education: college   Occupational History  .  [employer]   Social History Main Topics  . Smoking status: Never Smoker  . Smokeless tobacco: Never Used  . Alcohol use 0.6 oz/week    1 Glasses of wine per week     Comment: socially on weekends  . Drug use: No  . Sexual activity: Yes    Birth control/ protection: Other-see comments     Comment: VASECTOMY-1st intercourse 55 yo-Fewer than 5 partners   Other Topics Concern  . Not on file   Social History Narrative   Patient  Lives at home with her husband [name](William) [name]    Patient works full time at [employer]   Right handed   Caffeine two cups daily    Family History  Problem Relation Age of Onset  . Prostate cancer Father   . Heart attack Father [id number]  CBAG; pacer  . Stroke Father 94  . Alzheimer's disease Mother   . Coronary artery disease Paternal Uncle     Review of Systems  Constitutional: Negative for appetite change, chills, fatigue, fever and unexpected weight change.  HENT: Negative for hearing loss.   Eyes: Negative for visual disturbance.  Respiratory: Negative for cough, shortness of breath and wheezing.   Cardiovascular: Negative for chest pain, palpitations and leg swelling.  Gastrointestinal: Negative for abdominal pain, blood in stool, constipation, diarrhea and nausea.       No gerd  Genitourinary: Negative for dysuria and hematuria.  Musculoskeletal: Negative for arthralgias, back pain and myalgias.  Skin: Positive for color change (mole on face).  Neurological: Negative for dizziness, light-headedness, numbness and headaches.  Psychiatric/Behavioral: Negative for dysphoric mood. The patient is not  nervous/anxious.        Objective:   Vitals:   11/13/15 0759  BP: 128/86  Pulse: 84  Resp: 16  Temp: 98.7 F (37.1 C)   Filed Weights   11/13/15 0759  Weight: 176 lb (79.8 kg)   Body mass index is 25.99 kg/m.   Physical Exam    Constitutional: She appears well-developed and well-nourished. No distress.  HENT:  Head: Normocephalic and atraumatic.  Right Ear: External ear normal. Normal ear canal and TM Left Ear: External ear normal.  Normal ear canal and TM Mouth/Throat: Oropharynx is clear and moist.  Eyes: Conjunctivae and EOM are normal.  Neck: Neck supple. No tracheal deviation present. No thyromegaly present.  No carotid bruit  Cardiovascular: Normal rate, regular rhythm and normal heart sounds.   No murmur heard.  No edema. Pulmonary/Chest: Effort normal and breath sounds normal. No respiratory distress. She has no wheezes. She has no rales.  Breast: deferred to Gyn Abdominal: Soft. She exhibits no distension. There is no tenderness.  Lymphadenopathy: She has no cervical adenopathy.  Skin: Skin is warm and dry. She is not diaphoretic.  Psychiatric: She has a normal mood and affect. Her behavior is normal.       Assessment & Plan:   Physical exam: Screening blood work ordered Immunizations  Flu vaccine Colonoscopy  Up to date  Mammogram   Up to date  Gyn  Up to date  Dexa  Up to date  - done through gyn Eye exams  Up to date  Exercise - no regular exercise - knows she needs to start Weight -  Will work on weight loss Skin  - concern over a mole on her face - refer to derm for skin check Substance abuse  -  none  See Problem List for Assessment and Plan of chronic medical problems.

## 2015-11-26 ENCOUNTER — Encounter: Payer: Self-pay | Admitting: Internal Medicine

## 2016-08-04 ENCOUNTER — Telehealth: Payer: Self-pay | Admitting: Adult Health

## 2016-08-04 MED ORDER — LAMOTRIGINE 100 MG PO TABS: ORAL_TABLET | ORAL | 0 refills | Status: DC

## 2016-08-04 NOTE — Addendum Note (Signed)
Addended by: Guy BeginYOUNG, Lilie Vezina S on: 08/04/2016 10:19 AM   Modules accepted: Orders

## 2016-08-04 NOTE — Telephone Encounter (Signed)
Patient called office requesting refill for lamoTRIgine (LAMICTAL) 100 MG tablet.  Patient is out of town and will be driving home she is requesting a 5 day supply [facility]acy-  [facility] [address] [place], [place] phone number [phone].  Patient is requesting a call before 11:00am due to number provided is  [employer].

## 2016-08-04 NOTE — Telephone Encounter (Signed)
[facility] [place] [place] Spoke to pt and relayed that this was done for her.  She stated will f/u with her pcp for further refills.  She has no f/u scheduled.  (if this is a problem she will contact us as I see she had see new pcp once and no mention of this in note about following for this).

## 2016-09-09 ENCOUNTER — Telehealth: Payer: Self-pay | Admitting: Adult Health

## 2016-09-09 MED ORDER — LAMOTRIGINE 100 MG PO TABS: 100.0000 mg | ORAL_TABLET | Freq: Two times a day (BID) | ORAL | 0 refills | Status: DC

## 2016-09-09 NOTE — Telephone Encounter (Signed)
Pt called the clinic yesterday she was outside in the early am working in the yard, she came in the house, she was hot and nauseated. Said she is foggy between 9am - noon as to any events that might have taken place . She remembers talking with her repair man at noon, she was a little better. Said she is washed out today and has bite marks in her mouth, she is thinking it she could have had a seizure. [facility]  [facility]I

## 2016-09-09 NOTE — Addendum Note (Signed)
Addended by: Enedina FinnerMILLIKAN, Dacie Mandel P on: 09/09/2016 01:52 PM   Modules accepted: Orders

## 2016-09-09 NOTE — Telephone Encounter (Signed)
I called the patient. She reports a lapse in time. She is unsure if she had a syncope event or seizure. She states that it was unwitnessed. She did have bite marks on her cheek. She reports that she did not lose consciousness. Denies missing any medication. She will increase Lamictal to 100 mg twice a day. Advised that if she begins to have any concerning symptoms to call 911 or have some one take her to ED. She voiced understanding. No driving until seizure free for 6 months. She will come in Tuesday for an office visit.

## 2016-09-13 ENCOUNTER — Encounter: Payer: Self-pay | Admitting: Adult Health

## 2016-09-13 ENCOUNTER — Ambulatory Visit (INDEPENDENT_AMBULATORY_CARE_PROVIDER_SITE_OTHER): Payer: BC Managed Care – PPO | Admitting: Adult Health

## 2016-09-13 VITALS — BP 126/85 | HR 93 | Ht 69.0 in | Wt 174.4 lb

## 2016-09-13 DIAGNOSIS — R569 Unspecified convulsions: Secondary | ICD-10-CM | POA: Diagnosis not present

## 2016-09-13 DIAGNOSIS — Z5181 Encounter for therapeutic drug level monitoring: Secondary | ICD-10-CM | POA: Diagnosis not present

## 2016-09-13 NOTE — Patient Instructions (Signed)
Your Plan:  Continue Lamictal 100 mg twice a day Blood work today You should not drive, operate heavy machinery, perform activities at heights or participate in water activities until 6 months seizure free   Thank you for coming [facility] at [facility]. I hope we have been able to provide you high quality care today.  You may receive a patient satisfaction survey over the next few weeks. We would appreciate your feedback and comments so that we may continue to improve ourselves and the health of our patients.

## 2016-09-13 NOTE — Progress Notes (Signed)
PATIENT: Danielle Gamble DOB: 1960-07-23  REASON FOR VISIT: follow up- Seizures HISTORY FROM: patient  HISTORY OF PRESENT ILLNESS: Danielle Gamble is a 56 year old female with a history of seiures. She returns today for follow-up. The patient reports that she has been stable for several years. This past week she was at home waiting for repair man to come. She states that she went out into the yard to do some yard work. She states that she did have a lapse in time. However she remembers crawling on her knees in the kitchen. She states that she did at some point hit her head. He had bit her tongue. This event was unwitnessed. She states that she did eat a shrimp roll that morning that could have potentially been bad. She does remember feeling nauseous and having a headache. She reports that she did try to induce vomiting prior to the event. She also reports that she is unsure how long she was outside. Unsure if she became overheated. She denies missing any medications. Denies starting any new medications. She returns today for an evaluation.  HISTORY 09/08/15: Danielle Gamble is a 56 year old female with a history of seizures. She returns today for follow-up. She is on Lamictal on tolerating it well. Denies any seizure events. Able to complete all ADLs and family. 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 Denies any changes in her gait or balance. She states her primary care recently retired and she has an appointment with her new PCP and October. At that visit she will have her blood work checked. She returns today for an evaluation.  HISTORY 04/16/13 Terrace Arabia(YAN): Danielle Gamble a 56 years old right-handed Caucasian female, previously patient of Dr. love, last clinical visit was January 2014,  She had a history of seizure, first one was in 1995, no warning signs, generalized tonic-clonic seizure, with tongue biting, incontinence, a second seizure was in 1997, generalized, mild body shaking,  MRI of the brain  in 1997 was normal, EEG was normal,  She was initially treated with Dilantin, has been on current dose of lamotrigine since 2011, 100 mg tablet, half tablet in the morning, 1 tablet every night, no side effect, doing very well, still driving, work full time [place] [employer] [place], coming in to refill her medications,   REVIEW OF SYSTEMS: Out of a complete 14 system review of symptoms, the patient complains only of the following symptoms, and all other reviewed systems are negative.  See HPI  ALLERGIES: No Known Allergies  HOME MEDICATIONS: Outpatient Medications Prior to Visit  Medication Sig Dispense Refill  . aspirin 81 MG tablet Take 81 mg by mouth daily.      . Calcium Carbonate-Vit D-Min (CALCIUM 1200 PO) Take by mouth daily.      Marland Kitchen. lamoTRIgine (LAMICTAL) 100 MG tablet Take 1 tablet (100 mg total) by mouth 2 (two) times daily. 60 tablet 0  . Multiple Vitamin (MULTIVITAMIN) tablet Take 1 tablet by mouth daily.      . simvastatin (ZOCOR) 20 MG tablet Take 1 tablet (20 mg total) by mouth at bedtime. 90 tablet 3   No facility-administered medications prior to visit.     PAST MEDICAL HISTORY: Past Medical History:  Diagnosis Date  . Allergic rhinitis    seasonal  . Hyperlipidemia   . Multinodular goiter (nontoxic)   . Osteopenia 08/2013   T score -1.7 FRAX 6.9%/0.6%  . Ovarian cyst    PMH of  . Seizure  disorder [facility])    Dr Sandria Manly    PAST SURGICAL HISTORY: Past Surgical History:  Procedure Laterality Date  . ABDOMINAL SURGERY     Expl. Laparotomy-Right ovar.cyst  . BIOPSY THYROID  2015   Dr Elvera Lennox  . COLONOSCOPY  2012   negative, F/U in 2022([facility])  . OVARIAN CYST SURGERY     done in [facility]  . Sternal Cystectomy      FAMILY HISTORY: Family History  Problem Relation Age of Onset  . Prostate cancer Father   . Heart attack Father 78       CBAG; pacer  . Stroke Father 31  . Alzheimer's disease Mother   . Coronary artery disease  Paternal Uncle     SOCIAL HISTORY: Social History   Social History  . Marital status: Married    Spouse name: [name]  . Number of children: 0  . Years of education: college   Occupational History  .  [employer]   Social History Main Topics  . Smoking status: Never Smoker  . Smokeless tobacco: Never Used  . Alcohol use 0.6 oz/week    1 Glasses of wine per week     Comment: socially on weekends  . Drug use: No  . Sexual activity: Yes    Birth control/ protection: Other-see comments     Comment: VASECTOMY-1st intercourse 56 yo-Fewer than 5 partners   Other Topics Concern  . Not on file   Social History Narrative   Patient  Lives at home with her husband [name]) [name]    Patient works full time at [employer]   Right handed   Caffeine two cups daily         PHYSICAL EXAM  Vitals:   09/13/16 1030  BP: 126/85  Pulse: 93  Weight: 174 lb 6.4 oz (79.1 kg)  Height: 5\' 9"  (1.753 m)   Body mass index is 25.75 kg/m.  Generalized: Well developed, in no acute distress   Neurological examination  Mentation: Alert oriented to time, place, history taking. Follows all commands speech and language fluent Cranial nerve II-XII: Pupils were equal round reactive to light. Extraocular movements were full, visual field were full on confrontational test. Facial sensation and strength were normal. Uvula tongue midline. Head turning and shoulder shrug  were normal and symmetric. Motor: The motor testing reveals 5 over 5 strength of all 4 extremities. Good symmetric motor tone is noted throughout.  Sensory: Sensory testing is intact to soft touch on all 4 extremities. No evidence of extinction is noted.  Coordination: Cerebellar testing reveals good finger-nose-finger and heel-to-shin bilaterally.  Gait and station: Gait is normal. Tandem gait is normal. Romberg is negative. No drift is seen.  Reflexes: Deep tendon reflexes are symmetric and normal  bilaterally.   DIAGNOSTIC DATA (LABS, IMAGING, TESTING) - I reviewed patient records, labs, notes, testing and imaging myself where available.  Lab Results  Component Value Date   WBC 5.3 11/15/2011   HGB 14.8 11/15/2011   HCT 45.1 11/15/2011   MCV 94.8 11/15/2011   PLT 215.0 11/15/2011      Component Value Date/Time   NA 140 11/15/2011 1043   K 3.8 11/15/2011 1043   CL 103 11/15/2011 1043   CO2 27 11/15/2011 1043   GLUCOSE 87 11/15/2011 1043   BUN 16 11/15/2011 1043   CREATININE 0.8 11/15/2011 1043   CALCIUM 9.8 11/15/2011 1043   PROT 7.6 05/23/2014 0732   ALBUMIN 4.5 05/23/2014 0732   AST  19 05/23/2014 0732   ALT 17 05/23/2014 0732   ALKPHOS 79 05/23/2014 0732   BILITOT 0.7 05/23/2014 0732   GFRNONAA 60.76 09/01/2009 1034   GFRAA 86 02/23/2006 1044   Lab Results  Component Value Date   CHOL 200 (H) 05/23/2014   HDL 63 05/23/2014   LDLCALC 103 (H) 05/23/2014   LDLDIRECT 111.9 11/13/2012   TRIG 169 (H) 05/23/2014   CHOLHDL 4 11/13/2012    Lab Results  Component Value Date   TSH 1.36 09/10/2014      ASSESSMENT AND PLAN 56 y.o. year old female  has a past medical history of Allergic rhinitis; Hyperlipidemia; Multinodular goiter (nontoxic); Osteopenia (08/2013); Ovarian cyst; and Seizure disorder (HCC). here with :  1. Seizures  It is unclear if this event represents a seizure. However since she bit her tongue and did have a lapse in time we will treat it as a seizure event. When she called the office Lamictal was increased to 100 mg twice a day. She is instructed to continue this. I will check blood work today.Patient is instructed that she should not operate a motor vehicle, operate heavy machinery, participate in water activities or perform activities at heights until seizure-free for 6 months. Patient voiced understanding. She will follow-up in 3-4 months with Dr. Terrace Arabia.  I spent 25 minutes with the patient. 50% of this time was spent reviewing seizure  precautions in medication.     Danielle Penny, MSN, NP-C 09/13/2016, 10:59 AM [facility] [address], [address] [place], [place] [address] [phone]

## 2016-09-14 LAB — COMPREHENSIVE METABOLIC PANEL
A/G RATIO: 1.8 (ref 1.2–2.2)
ALBUMIN: 4.7 g/dL (ref 3.5–5.5)
ALT: 22 IU/L (ref 0–32)
AST: 22 IU/L (ref 0–40)
Alkaline Phosphatase: 90 IU/L (ref 39–117)
BUN/Creatinine Ratio: 14 (ref 9–23)
BUN: 14 mg/dL (ref 6–24)
Bilirubin Total: 0.2 mg/dL (ref 0.0–1.2)
CALCIUM: 10.4 mg/dL — AB (ref 8.7–10.2)
CO2: 25 mmol/L (ref 20–29)
CREATININE: 0.97 mg/dL (ref 0.57–1.00)
Chloride: 103 mmol/L (ref 96–106)
GFR, EST AFRICAN AMERICAN: 75 mL/min/{1.73_m2} (ref >59)
GFR, EST NON AFRICAN AMERICAN: 65 mL/min/{1.73_m2} (ref >59)
GLOBULIN, TOTAL: 2.6 g/dL (ref 1.5–4.5)
GLUCOSE: 91 mg/dL (ref 65–99)
POTASSIUM: 4.9 mmol/L (ref 3.5–5.2)
SODIUM: 142 mmol/L (ref 134–144)
TOTAL PROTEIN: 7.3 g/dL (ref 6.0–8.5)

## 2016-09-14 LAB — CBC WITH DIFFERENTIAL/PLATELET
BASOS: 1 %
Basophils Absolute: 0 10*3/uL (ref 0.0–0.2)
EOS (ABSOLUTE): 0.2 10*3/uL (ref 0.0–0.4)
EOS: 3 %
HEMATOCRIT: 44.7 % (ref 34.0–46.6)
Hemoglobin: 14.5 g/dL (ref 11.1–15.9)
IMMATURE GRANS (ABS): 0 10*3/uL (ref 0.0–0.1)
IMMATURE GRANULOCYTES: 0 %
Lymphocytes Absolute: 2.2 10*3/uL (ref 0.7–3.1)
Lymphs: 36 %
MCH: 31.3 pg (ref 26.6–33.0)
MCHC: 32.4 g/dL (ref 31.5–35.7)
MCV: 96 fL (ref 79–97)
MONOS ABS: 0.4 10*3/uL (ref 0.1–0.9)
Monocytes: 7 %
NEUTROS ABS: 3.3 10*3/uL (ref 1.4–7.0)
NEUTROS PCT: 53 %
Platelets: 217 10*3/uL (ref 150–379)
RBC: 4.64 x10E6/uL (ref 3.77–5.28)
RDW: 13.7 % (ref 12.3–15.4)
WBC: 6.1 10*3/uL (ref 3.4–10.8)

## 2016-09-14 LAB — LAMOTRIGINE LEVEL: Lamotrigine Lvl: 7.5 ug/mL (ref 2.0–20.0)

## 2016-09-15 ENCOUNTER — Telehealth: Payer: Self-pay | Admitting: *Deleted

## 2016-09-15 NOTE — Telephone Encounter (Signed)
LVM informing patient her lab results are relatively unremarkable. Left number for any questions.

## 2016-09-15 NOTE — Progress Notes (Signed)
I have reviewed and agreed above plan. 

## 2016-09-28 ENCOUNTER — Encounter: Payer: Self-pay | Admitting: Gynecology

## 2016-09-30 ENCOUNTER — Other Ambulatory Visit: Payer: Self-pay | Admitting: Adult Health

## 2016-10-05 ENCOUNTER — Encounter: Payer: Self-pay | Admitting: Gynecology

## 2016-10-07 ENCOUNTER — Telehealth: Payer: Self-pay | Admitting: Gynecology

## 2016-10-07 ENCOUNTER — Encounter: Payer: Self-pay | Admitting: Gynecology

## 2016-10-07 ENCOUNTER — Encounter: Payer: Self-pay | Admitting: *Deleted

## 2016-10-07 NOTE — Telephone Encounter (Signed)
Sent pt mychart message

## 2016-10-07 NOTE — Telephone Encounter (Signed)
Tell patient her most recent bone density remains in the osteopenic range but she has lost some calcium from her last study. The calculated fracture risk is not increased indicate the need to take medication like Fosamax at this time. I would emphasize the need for weightbearing exercise like walking on a regular basis, maintaining adequate calcium intake at 1500 mg total dietary calcium daily and to check a vitamin D level and thyroid now either through our office or her primary physician's office to make sure she is in the therapeutic range. I would recommend repeating the bone density in 2 years.

## 2016-10-18 ENCOUNTER — Ambulatory Visit (INDEPENDENT_AMBULATORY_CARE_PROVIDER_SITE_OTHER): Payer: BC Managed Care – PPO | Admitting: Gynecology

## 2016-10-18 ENCOUNTER — Encounter: Payer: Self-pay | Admitting: Gynecology

## 2016-10-18 VITALS — BP 134/84 | Ht 69.0 in | Wt 174.0 lb

## 2016-10-18 DIAGNOSIS — N952 Postmenopausal atrophic vaginitis: Secondary | ICD-10-CM

## 2016-10-18 DIAGNOSIS — Z01411 Encounter for gynecological examination (general) (routine) with abnormal findings: Secondary | ICD-10-CM

## 2016-10-18 DIAGNOSIS — M8580 Other specified disorders of bone density and structure, unspecified site: Secondary | ICD-10-CM | POA: Diagnosis not present

## 2016-10-18 NOTE — Progress Notes (Signed)
    Danielle Gamble 04/11/1960 [id number]        56 y.o.  [name] for annual gynecologic exam.    Past medical history,surgical history, problem list, medications, allergies, family history and social history were all reviewed and documented as reviewed in the EPIC chart.  ROS:  Performed with pertinent positives and negatives included in the history, assessment and plan.   Additional significant findings :  None   Exam: [name] assistant Vitals:   10/18/16 1532  BP: 134/84  Weight: 174 lb (78.9 kg)  Height:  (1.753 m)   Body mass index is 25.7 kg/m.  General appearance:  Normal affect, orientation and appearance. Skin: Grossly normal HEENT: Without gross lesions.  No cervical or supraclavicular adenopathy. Thyroid normal.  Lungs:  Clear without wheezing, rales or rhonchi Cardiac: RR, without RMG Abdominal:  Soft, nontender, without masses, guarding, rebound, organomegaly or hernia Breasts:  Examined lying and sitting without masses, retractions, discharge or axillary adenopathy. Pelvic:  Ext, BUS, Vagina: With atrophic changes  Cervix: With atrophic changes  Uterus: Anteverted, normal size, shape and contour, midline and mobile nontender   Adnexa: Without masses or tenderness    Anus and perineum: Normal   Rectovaginal: Normal sphincter tone without palpated masses or tenderness.    Assessment/Plan:  56 y.o. [name] female for annual gynecologic exam. .   1. Postmenopausal/atrophic genital changes. No significant hot flushes, night sweats, vaginal dryness or any vaginal bleeding. Continue to monitor report any issues or bleeding. 2. Osteopenia.  DEXA 10/2016 T score -2.3 FRAX 9%/1.4%. Was done at [facility] where they used a conversion formula as they changed machines since her last bone density. Reviewed with patient I feel these findings are less trustworthy and my recommendation would be to maximize calcium/vitamin D have a vitamin D level checked at her next blood  draw coming up at her primary physician's office and then to repeat the bone density in 2 years and she agrees with this. 3. Pap smear/HPV 08/2013. No Pap smear done today. No history of significant abnormal Pap smears. Plan repeat Pap smear at 5 year interval per current screening guidelines. 4. Colonoscopy 6 years ago. Repeat at their recommended interval. 5. Mammography 08/2016. Continue with annual mammography next year. Breast exam normal today. 6. Health maintenance. No routine lab work done as patient reports this done elsewhere. Follow up 1 year, sooner as needed.   [name], 4:21 PM 10/18/2016

## 2016-10-18 NOTE — Patient Instructions (Addendum)
Follow up for annual exam as scheduled in one year

## 2016-11-07 ENCOUNTER — Telehealth: Payer: Self-pay | Admitting: Internal Medicine

## 2016-11-07 ENCOUNTER — Telehealth: Payer: Self-pay | Admitting: Adult Health

## 2016-11-07 NOTE — Telephone Encounter (Signed)
Pt would like to receive the Shingrix vaccine, was advised to call insurance company,she would like to receive it at her pharmacy on file  Please advise

## 2016-11-07 NOTE — Telephone Encounter (Signed)
error 

## 2016-11-08 MED ORDER — ZOSTER VAC RECOMB ADJUVANTED 50 MCG/0.5ML IM SUSR: 0.5000 mL | Freq: Once | INTRAMUSCULAR | 1 refills | Status: AC

## 2016-11-24 ENCOUNTER — Other Ambulatory Visit: Payer: Self-pay | Admitting: Internal Medicine

## 2016-11-24 DIAGNOSIS — E785 Hyperlipidemia, unspecified: Secondary | ICD-10-CM

## 2016-12-27 ENCOUNTER — Ambulatory Visit: Payer: BC Managed Care – PPO | Admitting: Neurology

## 2017-01-10 ENCOUNTER — Ambulatory Visit: Payer: BC Managed Care – PPO | Admitting: Neurology

## 2017-01-18 ENCOUNTER — Encounter: Payer: Self-pay | Admitting: Neurology

## 2017-01-18 ENCOUNTER — Ambulatory Visit: Payer: BC Managed Care – PPO | Admitting: Neurology

## 2017-01-18 VITALS — BP 139/95 | HR 91 | Ht 69.0 in | Wt 180.0 lb

## 2017-01-18 DIAGNOSIS — G40309 Generalized idiopathic epilepsy and epileptic syndromes, not intractable, without status epilepticus: Secondary | ICD-10-CM

## 2017-01-18 MED ORDER — LAMOTRIGINE 100 MG PO TABS: 100.0000 mg | ORAL_TABLET | Freq: Two times a day (BID) | ORAL | 4 refills | Status: DC

## 2017-01-18 NOTE — Progress Notes (Signed)
PATIENT: Danielle Gamble DOB: December 17, 1960  REASON FOR VISIT: follow up- Seizures HISTORY FROM: patient  HISTORY OF PRESENT ILLNESS:  Danielle Gamble a 56 years old right-handed Caucasian female, previously patient of Dr. Sandria Manly,  I saw her initially on March 17th 2015.  She had a history of seizure, first one was in 1995, no warning signs, generalized tonic-clonic seizure, with tongue biting, incontinence, a second seizure was in 1997, generalized, mild body shaking,  MRI of the brain in 1997 was normal, EEG was normal,  She was initially treated with Dilantin, has been on current dose of lamotrigine since 2011, 100 mg tablet, half tablet in the morning, 1 tablet every night, no side effect, doing very well, still driving, work full time as a Hospital doctor,   UPDATE Jan 18 2017: She had a seizure in August 2018, lamotrigine dose was increased to 100 mg twice a day, laboratory evaluations in August 2018, lamotrigine level 7.5, normal CBC, CMP showed mild elevated calcium 10.4.  She still works as Runner, broadcasting/film/video at school, she only has very vague memory of the event on August 11th 2018, woke up in the kitchen inside, tongue biting, confused, nauseous,    REVIEW OF SYSTEMS: Out of a complete 14 system review of symptoms, the patient complains only of the following symptoms, and all other reviewed systems are negative. Seizure  ALLERGIES: No Known Allergies  HOME MEDICATIONS: Outpatient Medications Prior to Visit  Medication Sig Dispense Refill  . aspirin 81 MG tablet Take 81 mg by mouth daily.      . Calcium Carbonate-Vit D-Min (CALCIUM 1200 PO) Take by mouth daily.      Marland Kitchen lamoTRIgine (LAMICTAL) 100 MG tablet TAKE 1/2 BY MOUTH IN THE AM, 1 BY MOUTH IN THE EVENING. (Patient taking differently: Taking one tablets twice daily.) 135 tablet 3  . Multiple Vitamin (MULTIVITAMIN) tablet Take 1 tablet by mouth daily.      . simvastatin (ZOCOR) 20 MG tablet Take 1 tablet (20  mg total) by mouth at bedtime. -- Office visit needed for further refills 30 tablet 0   No facility-administered medications prior to visit.     PAST MEDICAL HISTORY: Past Medical History:  Diagnosis Date  . Allergic rhinitis    seasonal  . Hyperlipidemia   . Multinodular goiter (nontoxic)   . Osteopenia 10/2016   T score -2.3 FRAX 9%/1.4%  . Ovarian cyst    PMH of  . Seizure disorder (HCC)    Dr Sandria Manly    PAST SURGICAL HISTORY: Past Surgical History:  Procedure Laterality Date  . ABDOMINAL SURGERY     Expl. Laparotomy-Right ovar.cyst  . BIOPSY THYROID  2015   Dr Elvera Lennox  . COLONOSCOPY  2012   negative, F/U in 2022([facility])  . OVARIAN CYST SURGERY     done in [facility]  . Sternal Cystectomy      FAMILY HISTORY: Family History  Problem Relation Age of Onset  . Prostate cancer Father   . Heart attack Father 54       CBAG; pacer  . Stroke Father 86  . Alzheimer's disease Mother   . Coronary artery disease Paternal Uncle     SOCIAL HISTORY: Social History   Socioeconomic History  . Marital status: Married    Spouse name: Danielle Gamble  . Number of children: 0  . Years of education: college  . Highest education level: Not on file  Social Needs  . Financial resource strain: Not on  file  . Food insecurity - worry: Not on file  . Food insecurity - inability: Not on file  . Transportation needs - medical: Not on file  . Transportation needs - non-medical: Not on file  Occupational History    Employer: [employer]  Tobacco Use  . [facility] status: Never Smoker  . Smokeless tobacco: Never Used  Substance and Sexual Activity  . Alcohol use: Yes    Alcohol/week: 0.6 oz    Types: 1 Glasses of wine per week    Comment: socially on weekends  . Drug use: No  . Sexual activity: Yes    Birth control/protection: Other-see comments    Comment: VASECTOMY-1st intercourse 56 yo-Fewer than 5 partners  Other Topics Concern  . Not on file  Social History  Narrative   Patient  Lives at home with her husband Danielle Gamble(Danielle Gamble) [name]    Patient works full time at [employer]   Right handed   Caffeine two cups daily      PHYSICAL EXAM  Vitals:   01/18/17 1522  BP: (!) 139/95  Pulse: 91  Weight: 180 lb (81.6 kg)  Height: 5\' 9"  (1.753 m)   Body mass index is 26.58 kg/m.  Generalized: Well developed, in no acute distress   Neurological examination  Mentation: Alert oriented to time, place, history taking. Follows all commands speech and language fluent Cranial nerve II-XII: Pupils were equal round reactive to light. Extraocular movements were full, visual field were full on confrontational test. Facial sensation and strength were normal. Uvula tongue midline. Head turning and shoulder shrug  were normal and symmetric. Motor: The motor testing reveals 5 over 5 strength of all 4 extremities. Good symmetric motor tone is noted throughout.  Sensory: Sensory testing is intact to soft touch on all 4 extremities. No evidence of extinction is noted.  Coordination: Cerebellar testing reveals good finger-nose-finger and heel-to-shin bilaterally.  Gait and station: Gait is normal. Tandem gait is normal. Romberg is negative. No drift is seen.  Reflexes: Deep tendon reflexes are symmetric and normal bilaterally.   DIAGNOSTIC DATA (LABS, IMAGING, TESTING) - I reviewed patient records, labs, notes, testing and imaging myself where available.  Lab Results  Component Value Date   WBC 6.1 09/13/2016   HGB 14.5 09/13/2016   HCT 44.7 09/13/2016   MCV 96 09/13/2016   PLT 217 09/13/2016      Component Value Date/Time   NA 142 09/13/2016 1115   K 4.9 09/13/2016 1115   CL 103 09/13/2016 1115   CO2 25 09/13/2016 1115   GLUCOSE 91 09/13/2016 1115   GLUCOSE 87 11/15/2011 1043   BUN 14 09/13/2016 1115   CREATININE 0.97 09/13/2016 1115   CALCIUM 10.4 (H) 09/13/2016 1115   PROT 7.3 09/13/2016 1115   ALBUMIN 4.7 09/13/2016 1115   AST 22  09/13/2016 1115   ALT 22 09/13/2016 1115   ALKPHOS 90 09/13/2016 1115   BILITOT 0.2 09/13/2016 1115   GFRNONAA 65 09/13/2016 1115   GFRAA 75 09/13/2016 1115   Lab Results  Component Value Date   CHOL 200 (H) 05/23/2014   HDL 63 05/23/2014   LDLCALC 103 (H) 05/23/2014   LDLDIRECT 111.9 11/13/2012   TRIG 169 (H) 05/23/2014   CHOLHDL 4 11/13/2012    Lab Results  Component Value Date   TSH 1.36 09/10/2014      ASSESSMENT AND PLAN 56 y.o. year old female    Epilepsy:  Most recurrent seizure was in August 2018  Continue lamotrigine 100 mg twice a day.      PATIENT: Danielle Gamble DOB: [birth year]  Chief Complaint  Patient presents with  . Seizures    She is here with her husband, Danielle Gamble.  She had one seizure-like episode in August 2018 that resulted in her Lamical being increased to 100mg , BID.  She has not had any further events since taking the higher dose.      HISTORICAL  Danielle Lemonsoberta R Gamble, seen in refer by    REVIEW OF SYSTEMS: Full 14 system review of systems performed and notable only for as above  ALLERGIES: No Known Allergies  HOME MEDICATIONS: Current Outpatient Medications  Medication Sig Dispense Refill  . aspirin 81 MG tablet Take 81 mg by mouth daily.      . Calcium Carbonate-Vit D-Min (CALCIUM 1200 PO) Take by mouth daily.      Marland Kitchen. lamoTRIgine (LAMICTAL) 100 MG tablet Take 1 tablet (100 mg total) by mouth 2 (two) times daily. 180 tablet 4  . Multiple Vitamin (MULTIVITAMIN) tablet Take 1 tablet by mouth daily.      . simvastatin (ZOCOR) 20 MG tablet Take 1 tablet (20 mg total) by mouth at bedtime. -- Office visit needed for further refills 30 tablet 0   No current facility-administered medications for this visit.     PAST MEDICAL HISTORY: Past Medical History:  Diagnosis Date  . Allergic rhinitis    seasonal  . Hyperlipidemia   . Multinodular goiter (nontoxic)   . Osteopenia 10/2016   T score -2.3 FRAX 9%/1.4%  . Ovarian cyst    PMH of  .  Seizure disorder (HCC)    Dr Sandria ManlyLove    PAST SURGICAL HISTORY: Past Surgical History:  Procedure Laterality Date  . ABDOMINAL SURGERY     Expl. Laparotomy-Right ovar.cyst  . BIOPSY THYROID  2015   Dr Elvera LennoxGherghe  . COLONOSCOPY  2012   negative, F/U in 2022([facility])  . OVARIAN CYST SURGERY     [place] [facility]  . Sternal Cystectomy      FAMILY HISTORY: Family History  Problem Relation Age of Onset  . Prostate cancer Father   . Heart attack Father [id number]       CBAG; pacer  . Stroke Father [id number]  . Alzheimer's disease Mother   . Coronary artery disease Paternal Uncle     SOCIAL HISTORY:  Social History   Socioeconomic History  . Marital status: Married    Spouse name: Danielle NoaWilliam  . Number of children: 0  . Years of education: college  . Highest education level: Not on file  Social Needs  . Financial resource strain: Not on file  . Food insecurity - worry: Not on file  . Food insecurity - inability: Not on file  . Transportation needs - medical: Not on file  . Transportation needs - non-medical: Not on file  Occupational History    [employer]er[employer]  Tobacco Use  . Smoking status: Never Smoker  . Smokeless tobacco: Never Used  Substance and Sexual Activity  . Alcohol use: Yes    Alcohol/week: 0.6 oz    Types: 1 Glasses of wine per week    Comment: socially on weekends  . Drug use: No  . Sexual activity: Yes    Birth control/protection: Other-see comments    Comment: VASECTOMY-1st intercourse 56 yo-Fewer than 5 partners  Other Topics Concern  . Not on file  Social History Narrative   Patient  Lives at home with  her husband Danielle Gamble) [name]    Patient works full time at [employer]   Right handed   Caffeine two cups daily     PHYSICAL EXAM   Vitals:   01/18/17 1522  BP: (!) 139/95  Pulse: 91  Weight: 180 lb (81.6 kg)  Height: 5\' 9"  (1.753 m)    Not recorded      Body mass index is 26.58 kg/m.  PHYSICAL  EXAMNIATION:  Gen: NAD, conversant, well nourised, obese, well groomed                     Cardiovascular: Regular rate rhythm, no peripheral edema, warm, nontender. Eyes: Conjunctivae clear without exudates or hemorrhage Neck: Supple, no carotid bruits. Pulmonary: Clear to auscultation bilaterally   NEUROLOGICAL EXAM:  MENTAL STATUS: Speech:    Speech is normal; fluent and spontaneous with normal comprehension.  Cognition:     Orientation to time, place and person     Normal recent and remote memory     Normal Attention span and concentration     Normal Language, naming, repeating,spontaneous speech     Fund of knowledge   CRANIAL NERVES: CN II: Visual fields are full to confrontation. Fundoscopic exam is normal with sharp discs and no vascular changes. Pupils are round equal and briskly reactive to light. CN III, IV, VI: extraocular movement are normal. No ptosis. CN V: Facial sensation is intact to pinprick in all 3 divisions bilaterally. Corneal responses are intact.  CN VII: Face is symmetric with normal eye closure and smile. CN VIII: Hearing is normal to rubbing fingers CN IX, X: Palate elevates symmetrically. Phonation is normal. CN XI: Head turning and shoulder shrug are intact CN XII: Tongue is midline with normal movements and no atrophy.  MOTOR: There is no pronator drift of out-stretched arms. Muscle bulk and tone are normal. Muscle strength is normal.  REFLEXES: Reflexes are 2+ and symmetric at the biceps, triceps, knees, and ankles. Plantar responses are flexor.  SENSORY: Intact to light touch, pinprick, positional sensation and vibratory sensation are intact in fingers and toes.  COORDINATION: Rapid alternating movements and fine finger movements are intact. There is no dysmetria on finger-to-nose and heel-knee-shin.    GAIT/STANCE: Posture is normal. Gait is steady with normal steps, base, arm swing, and turning. Heel and toe walking are normal. Tandem gait  is normal.  Romberg is absent.   DIAGNOSTIC DATA (LABS, IMAGING, TESTING) - I reviewed patient records, labs, notes, testing and imaging myself where available.   ASSESSMENT AND PLAN  ESTELA VINAL is a 56 y.o. female      Levert Feinstein, M.D. Ph.D.  [facility] [address], [address] [place], [place] [address] Ph: [phone] Fax: [phone]  CC: Referring Provider

## 2017-02-07 ENCOUNTER — Telehealth: Payer: Self-pay | Admitting: Emergency Medicine

## 2017-02-07 MED ORDER — ZOSTER VAC RECOMB ADJUVANTED 50 MCG/0.5ML IM SUSR: 0.5000 mL | Freq: Once | INTRAMUSCULAR | 1 refills | Status: AC

## 2017-02-07 NOTE — Telephone Encounter (Signed)
Pt's spouse came into office today requesting shingles vaccine be sent to [facility].

## 2017-02-08 MED FILL — SHINGRIX 50 MCG SUS: 50 | 1 days supply | Qty: 1 | Fill #0

## 2017-04-25 MED FILL — SHINGRIX 50 MCG SUS: 50 | 1 days supply | Qty: 1 | Fill #1

## 2017-05-08 ENCOUNTER — Ambulatory Visit: Payer: Self-pay | Admitting: *Deleted

## 2017-05-08 NOTE — Telephone Encounter (Signed)
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 [1] Patient also has allergy symptoms (e.g., itchy eyes, clear nasal discharge, postnasal drip) AND [2] they are acting up  Answer Assessment - Initial Assessment Questions 1. ONSET: "When did the cough begin?"       1 Week   2. SEVERITY: "How bad is the cough today?"        Worse  Over  Last   sev  Days    3. RESPIRATORY DISTRESS: "Describe your breathing."          Mouth   Breathing  Due  To  Nasal  Congestion   4. FEVER: "Do you have a fever?" If so, ask: "What is your temperature, how was it measured, and when did it start?"     No 5. HEMOPTYSIS: "Are you coughing up any blood?" If so ask: "How much?" (flecks, streaks, tablespoons, etc.)       No 6. TREATMENT: "What have you done so far to treat the cough?" (e.g., meds, fluids, humidifier)     Nasal cort otc    Anti histamine  otc    7. CARDIAC HISTORY: "Do you have any history of heart disease?" (e.g., heart attack, congestive heart failure)      No 8. LUNG HISTORY: "Do you have any history of lung disease?"  (e.g., pulmonary embolus, asthma, emphysema)     No 9. PE RISK FACTORS: "Do you have a history of blood clots?" (or: recent major surgery, recent prolonged travel, bedridden )     n/a 10. OTHER SYMPTOMS: "Do you have any other symptoms? (e.g., runny nose, wheezing, chest pain)       Post  Nasal   Drip    Nasal  Congestion   11. PREGNANCY: "Is there any chance you are pregnant?" "When was your last menstrual period?"       n/a 12. TRAVEL: "Have you traveled out of the country in the last month?" (e.g., travel history, exposures)       n/a  Protocols used: COUGH - ACUTE NON-PRODUCTIVE-A-AH

## 2017-05-09 ENCOUNTER — Encounter: Payer: Self-pay | Admitting: Physician Assistant

## 2017-05-09 ENCOUNTER — Ambulatory Visit: Payer: BC Managed Care – PPO | Admitting: Physician Assistant

## 2017-05-09 VITALS — BP 124/80 | HR 95 | Temp 98.5°F | Ht 69.0 in | Wt 178.0 lb

## 2017-05-09 DIAGNOSIS — J069 Acute upper respiratory infection, unspecified: Secondary | ICD-10-CM

## 2017-05-09 MED ORDER — DOXYCYCLINE HYCLATE 100 MG PO TABS: 100.0000 mg | ORAL_TABLET | Freq: Two times a day (BID) | ORAL | 0 refills | Status: DC

## 2017-05-09 NOTE — Patient Instructions (Addendum)
It was great to see you!  You have a viral upper respiratory infection. Antibiotics are not needed for this.  Viral infections usually take 7-10 days to resolve.  The cough can last a few weeks to go away.  Push fluids and get plenty of rest. Please return if you are not improving as expected, or if you have high fevers (>101.5) or difficulty swallowing or worsening productive cough.  If symptoms do not improve, please begin the oral antibiotic.  Call clinic with questions.  I hope you start feeling better soon!

## 2017-05-09 NOTE — Progress Notes (Signed)
Danielle LemonsRoberta R Gamble is a 57 y.o. female here for a new problem.  I acted as a Neurosurgeonscribe for Energy East CorporationSamantha Sissi Padia, PA-C Danielle Mullonna Orphanos, LPN  History of Present Illness:   Chief Complaint  Patient presents with  . Cough    Cough  This is a new problem. Episode onset: Started Saturday. The problem has been gradually worsening. The problem occurs constantly. The cough is non-productive. Associated symptoms include ear congestion, headaches, nasal congestion, postnasal drip and a sore throat. Pertinent negatives include no chills, ear pain, fever, shortness of breath or wheezing. The symptoms are aggravated by lying down. Treatments tried: Mucinex. The treatment provided no relief. There is no history of asthma, bronchitis or pneumonia.   Danielle Gamble spent a lot of time outside on Saturday. Mucinex is somewhat helping. Very poor sleep. No recent travel.  Past Medical History:  Diagnosis Date  . Allergic rhinitis    seasonal  . Hyperlipidemia   . Multinodular goiter (nontoxic)   . Osteopenia 10/2016   T score -2.3 FRAX 9%/1.4%  . Ovarian cyst    PMH of  . Seizure disorder [facility](HCC)    Dr Sandria ManlyLove     Social History   Socioeconomic History  . Marital status: Married    Spouse name: Danielle NoaWilliam  . Number of children: 0  . Years of education: college  . Highest education level: Not on file  Occupational History    Employer: [employer]  Social Needs  . Financial resource strain: Not on file  . Food insecurity:    Worry: Not on file    Inability: Not on file  . Transportation needs:    Medical: Not on file    Non-medical: Not on file  Tobacco Use  . Smoking status: Never Smoker  . Smokeless tobacco: Never Used  Substance and Sexual Activity  . Alcohol use: Yes    Alcohol/week: 0.6 oz    Types: 1 Glasses of wine per week    Comment: socially on weekends  . Drug use: No  . Sexual activity: Yes    Birth control/protection: Other-see comments    Comment: VASECTOMY-1st intercourse 57  yo-Fewer than 5 partners  Lifestyle  . Physical activity:    Days per week: Not on file    Minutes per session: Not on file  . Stress: Not on file  Relationships  . Social connections:    Talks on phone: Not on file    Gets together: Not on file    Attends religious service: Not on file    Active member of club or organization: Not on file    Attends meetings of clubs or organizations: Not on file    Relationship status: Not on file  . Intimate partner violence:    Fear of current or ex partner: Not on file    Emotionally abused: Not on file    Physically abused: Not on file    Forced sexual activity: Not on file  Other Topics Concern  . Not on file  Social History Narrative   Patient  Lives at home with Danielle Gamble husband Danielle Gamble    Patient works full time at [employer]   Right handed   Caffeine two cups daily    Past Surgical History:  Procedure Laterality Date  . ABDOMINAL SURGERY     Expl. Laparotomy-Right ovar.cyst  . BIOPSY THYROID  2015   Dr Elvera LennoxGherghe  . COLONOSCOPY  2012   negative, F/U in 2022([facility])  . OVARIAN  CYST SURGERY     done in [facility]  . Sternal Cystectomy      Family History  Problem Relation Age of Onset  . Prostate cancer Father   . Heart attack Father 58       CBAG; pacer  . Stroke Father 54  . Alzheimer's disease Mother   . Coronary artery disease Paternal Uncle     No Known Allergies  Current Medications:   Current Outpatient Medications:  .  aspirin 81 MG tablet, Take 81 mg by mouth daily.  , Disp: , Rfl:  .  Calcium Carbonate-Vit D-Min (CALCIUM 1200 PO), Take by mouth daily.  , Disp: , Rfl:  .  lamoTRIgine (LAMICTAL) 100 MG tablet, Take 1 tablet (100 mg total) by mouth 2 (two) times daily., Disp: 180 tablet, Rfl: 4 .  Multiple Vitamin (MULTIVITAMIN) tablet, Take 1 tablet by mouth daily.  , Disp: , Rfl:  .  simvastatin (ZOCOR) 20 MG tablet, Take 1 tablet (20 mg total) by mouth at bedtime. -- Office  visit needed for further refills (Patient not taking: Reported on 05/09/2017), Disp: 30 tablet, Rfl: 0   Review of Systems:   Review of Systems  Constitutional: Negative for chills and fever.  HENT: Positive for postnasal drip and sore throat. Negative for ear pain.   Respiratory: Positive for cough. Negative for shortness of breath and wheezing.   Neurological: Positive for headaches.    Vitals:   Vitals:   05/09/17 1554  BP: 124/80  Pulse: 95  Temp: 98.5 F (36.9 C)  TempSrc: Oral  SpO2: 95%  Weight: 178 lb (80.7 kg)  Height: 5\' 9"  (1.753 m)     Body mass index is 26.29 kg/m.  Physical Exam:   Physical Exam  Constitutional: Danielle Gamble appears well-developed. Danielle Gamble is cooperative.  Non-toxic appearance. Danielle Gamble does not have a sickly appearance. Danielle Gamble does not appear ill. No distress.  HENT:  Head: Normocephalic and atraumatic.  Right Ear: Tympanic membrane, external ear and ear canal normal. Tympanic membrane is not erythematous, not retracted and not bulging.  Left Ear: Tympanic membrane, external ear and ear canal normal. Tympanic membrane is not erythematous, not retracted and not bulging.  Nose: Mucosal edema and rhinorrhea present. Right sinus exhibits no maxillary sinus tenderness and no frontal sinus tenderness. Left sinus exhibits no maxillary sinus tenderness and no frontal sinus tenderness.  Mouth/Throat: Uvula is midline and mucous membranes are normal. Posterior oropharyngeal erythema present. No posterior oropharyngeal edema. Tonsils are 1+ on the right. Tonsils are 1+ on the left.  Eyes: Conjunctivae and lids are normal.  Neck: Trachea normal.  Cardiovascular: Normal rate, regular rhythm, S1 normal, S2 normal and normal heart sounds.  Pulmonary/Chest: Effort normal and breath sounds normal. Danielle Gamble has no decreased breath sounds. Danielle Gamble has no wheezes. Danielle Gamble has no rhonchi. Danielle Gamble has no rales.  Lymphadenopathy:    Danielle Gamble has no cervical adenopathy.  Neurological: Danielle Gamble is alert.  Skin:  Skin is warm, dry and intact.  Psychiatric: Danielle Gamble has a normal mood and affect. Danielle Gamble speech is normal and behavior is normal.  Nursing note and vitals reviewed.   Assessment and Plan:    Margean was seen today for cough.  Diagnoses and all orders for this visit:  Upper respiratory tract infection, unspecified type  No red flags on exam.  We discussed using Delsym. Danielle Gamble would like a codeine-containing cough syrup to help Danielle Gamble symptoms, however I reviewed with Danielle Gamble the risk of codeine + lamictal possibly decreasing seizure threshold  and we agreed to not use this medication. I did provide a pocket prescription of Doxycycline should Danielle Gamble symptoms not improve. Discussed taking medications as prescribed. Reviewed return precautions including worsening fever, SOB, worsening cough or other concerns. Push fluids and rest. I recommend that patient follow-up if symptoms worsen or persist despite treatment x 7-10 days, sooner if needed.   . Reviewed expectations re: course of current medical issues. . Discussed self-management of symptoms. . Outlined signs and symptoms indicating need for more acute intervention. . Patient verbalized understanding and all questions were answered. . See orders for this visit as documented in the electronic medical record. . Patient received an After-Visit Summary.  CMA or LPN served as scribe during this visit. History, Physical, and Plan performed by medical provider. Documentation and orders reviewed and attested to.  Jarold Motto, PA-C

## 2017-10-26 ENCOUNTER — Ambulatory Visit: Payer: BC Managed Care – PPO | Admitting: Gynecology

## 2017-10-26 ENCOUNTER — Encounter: Payer: Self-pay | Admitting: Gynecology

## 2017-10-26 VITALS — BP 118/78 | Ht 69.0 in | Wt 173.0 lb

## 2017-10-26 DIAGNOSIS — Z1151 Encounter for screening for human papillomavirus (HPV): Secondary | ICD-10-CM

## 2017-10-26 DIAGNOSIS — Z01419 Encounter for gynecological examination (general) (routine) without abnormal findings: Secondary | ICD-10-CM

## 2017-10-26 DIAGNOSIS — M8580 Other specified disorders of bone density and structure, unspecified site: Secondary | ICD-10-CM | POA: Diagnosis not present

## 2017-10-26 DIAGNOSIS — N952 Postmenopausal atrophic vaginitis: Secondary | ICD-10-CM | POA: Diagnosis not present

## 2017-10-26 NOTE — Progress Notes (Signed)
    Danielle Danielle Gamble 1960/10/23 [id number]        57 y.o.  [name] for annual gynecologic exam.  Doing well without gynecologic complaints  Past medical history,surgical history, problem list, medications, allergies, family history and social history were all reviewed and documented as reviewed in the EPIC chart.  ROS:  Performed with pertinent positives and negatives included in the history, assessment and plan.   Additional significant findings : None   Exam: [name] assistant Vitals:   10/26/17 1601  BP: 118/78  Weight: 173 lb (78.5 kg)  Height: 5\' 9"  (1.753 m)   Body mass index is 25.55 kg/m.  General appearance:  Normal affect, orientation and appearance. Skin: Grossly normal HEENT: Without gross lesions.  No cervical or supraclavicular adenopathy. Thyroid normal.  Lungs:  Clear without wheezing, rales or rhonchi Cardiac: RR, without RMG Abdominal:  Soft, nontender, without masses, guarding, rebound, organomegaly or hernia Breasts:  Examined lying and sitting without masses, retractions, discharge or axillary adenopathy. Pelvic:  Ext, BUS, Vagina: Normal with atrophic changes  Cervix: Normal with atrophic changes.  Pap smear/HPV  Uterus: Anteverted, normal size, shape and contour, midline and mobile nontender   Adnexa: Without masses or tenderness    Anus and perineum: Normal   Rectovaginal: Normal sphincter tone without palpated masses or tenderness.    Assessment/Plan:  56 y.o. [name] female for annual gynecologic exam.   1. Postmenopausal/atrophic genital changes.  No significant menopausal symptoms or any bleeding. 2. Osteopenia.  DEXA 2018 T score -2.3 FRAX 9% / 1.4%.  Patient will repeat next year at 2-year interval. 3. Mammography last week.  Continue with annual mammography next year.  Breast exam normal today. 4. Pap smear/HPV 2015.  Pap smear/HPV today.  No history of abnormal Pap smears previously.  Plan repeat Pap smear/HPV at 5-year intervals per  current screening guidelines. 5. Colonoscopy 7 years ago with reported repeat interval 10 years. 6. Health maintenance.  No routine lab work done as patient does this elsewhere.  Follow-up 1 year, sooner as needed.   [name], 4:23 PM 10/26/2017

## 2017-10-26 NOTE — Patient Instructions (Signed)
Follow-up in 1 year for annual exam, sooner if any issues. 

## 2017-10-27 LAB — PAP IG AND HPV HIGH-RISK: HPV DNA High Risk: NOT DETECTED

## 2018-01-15 ENCOUNTER — Encounter: Payer: Self-pay | Admitting: Family

## 2018-01-15 ENCOUNTER — Ambulatory Visit: Payer: Self-pay | Admitting: *Deleted

## 2018-01-15 ENCOUNTER — Ambulatory Visit: Payer: BC Managed Care – PPO | Admitting: Family

## 2018-01-15 VITALS — BP 120/78 | HR 95 | Temp 98.9°F | Wt 172.0 lb

## 2018-01-15 DIAGNOSIS — J209 Acute bronchitis, unspecified: Secondary | ICD-10-CM | POA: Diagnosis not present

## 2018-01-15 DIAGNOSIS — B351 Tinea unguium: Secondary | ICD-10-CM | POA: Diagnosis not present

## 2018-01-15 MED ORDER — GUAIFENESIN-CODEINE 100-10 MG/5ML PO SOLN: 5.0000 mL | Freq: Four times a day (QID) | ORAL | 0 refills | Status: DC | PRN

## 2018-01-15 MED ORDER — CEFDINIR 300 MG PO CAPS: 300.0000 mg | ORAL_CAPSULE | Freq: Two times a day (BID) | ORAL | 0 refills | Status: DC

## 2018-01-15 NOTE — Progress Notes (Signed)
Danielle Gamble is a 57 y.o. female with the following history as recorded in EpicCare:  Patient Active Problem List   Diagnosis Date Noted  . 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 Callus of foot 09/12/2012  . Onychomycosis 09/12/2012  . 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 Porokeratosis 09/12/2012  . Fissure in skin of foot 09/12/2012  . Multinodular goiter 11/29/2011  . Ovarian cyst   . Osteopenia 09/10/2010  . Hyperlipidemia 09/01/2009  . ALLERGIC RHINITIS 06/07/2008    Current Outpatient Medications  Medication Sig Dispense Refill  . aspirin 81 MG tablet Take 81 mg by mouth daily.      . Calcium Carbonate-Vit D-Min (CALCIUM 1200 PO) Take by mouth daily.      Marland Kitchen lamoTRIgine (LAMICTAL) 100 MG tablet Take 1 tablet (100 mg total) by mouth 2 (two) times daily. 180 tablet 4  . Multiple Vitamin (MULTIVITAMIN) tablet Take 1 tablet by mouth daily.      . cefdinir (OMNICEF) 300 MG capsule Take 1 capsule (300 mg total) by mouth 2 (two) times daily. 20 capsule 0  . guaiFENesin-codeine 100-10 MG/5ML syrup Take 5 mLs by mouth every 6 (six) hours as needed for cough. 120 mL 0   No current facility-administered medications for this visit.     Allergies: Patient has no known allergies.  Past Medical History:  Diagnosis Date  . Allergic rhinitis    seasonal  . Hyperlipidemia   . Multinodular goiter (nontoxic)   . Osteopenia 10/2016   T score -2.3 FRAX 9%/1.4%  . Ovarian cyst    PMH of  . Seizure disorder [facility])    Dr Sandria Manly    Past Surgical History:  Procedure Laterality Date  . ABDOMINAL SURGERY     Expl. Laparotomy-Right ovar.cyst  . BIOPSY THYROID  2015   Dr Elvera Lennox  . COLONOSCOPY  2012   negative, F/U in 2022([facility])  . OVARIAN CYST SURGERY     done in [facility]  . Sternal Cystectomy      Family History  Problem Relation Age of Onset  . Prostate cancer Father   . Heart attack Father 10       CBAG; pacer  . Stroke Father 31  . Alzheimer's disease  Mother   . Coronary artery disease Paternal Uncle     Social History   Tobacco Use  . Smoking status: Never Smoker  . Smokeless tobacco: Never Used  Substance Use Topics  . Alcohol use: Yes    Alcohol/week: 1.0 standard drinks    Types: 1 Glasses of wine per week    Comment: socially on weekends    Subjective:  3 day history of cough/ congestion; feels congestion moving into chest; no fever, no chest pain, no shortness of breath; no wheezing; not prone to bronchitis or pneumonia; has tried OTC antihistamine with no benefit; + deep, barking cough;   Also asks about referral to podiatrist; having increased pain, discoloration, thickening with her toenails; wants to discuss treatment options.    Objective:  Vitals:   01/15/18 1422  BP: 120/78  Pulse: 95  Temp: 98.9 F (37.2 C)  TempSrc: Oral  Weight: 172 lb (78 kg)    General: Well developed, well nourished, in no acute distress  Skin : Warm and dry.  Head: Normocephalic and atraumatic  Eyes: Sclera and conjunctiva clear; pupils round and reactive to light; extraocular movements intact  Ears: External normal; canals clear; tympanic membranes normal  Oropharynx: Pink, supple. No suspicious lesions  Neck:  Supple without thyromegaly, adenopathy  Lungs: Respirations unlabored; clear to auscultation bilaterally without wheeze, rales, rhonchi  CVS exam: normal rate and regular rhythm.  Neurologic: Alert and oriented; speech intact; face symmetrical; moves all extremities well; CNII-XII intact without focal deficit   Assessment:  1. Acute bronchitis, unspecified organism   2. Toenail fungus     Plan:  1. Rx for Omnicef 300 mg bid x 10 days, Robitussin AC; increase fluids, rest and follow-up worse, no better. 2. Refer to podiatry as requested.   No follow-ups on file.  Orders Placed This Encounter  Procedures  . Ambulatory referral to Podiatry    Referral Priority:   Routine    Referral Type:   Consultation    Referral  Reason:   Specialty Services Required    Requested Specialty:   Podiatry    Number of Visits Requested:   1    Requested Prescriptions   Signed Prescriptions Disp Refills  . cefdinir (OMNICEF) 300 MG capsule 20 capsule 0    Sig: Take 1 capsule (300 mg total) by mouth 2 (two) times daily.  Marland Kitchen. guaiFENesin-codeine 100-10 MG/5ML syrup 120 mL 0    Sig: Take 5 mLs by mouth every 6 (six) hours as needed for cough.

## 2018-01-15 NOTE — Telephone Encounter (Signed)
Pt reports productive cough, sore throat, hoarseness onset Saturday. States cough productive for thick dark tan phlegm. States ears "Clogged." Reports throat red, "Not as sore this morning." Denies fever. Took Mucinex this AM, ineffective. States nose "runny."  Denies any sinus tenderness, earache. Home care advise offered, pt requesting appt. 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 Same day appt made with Ria ClockLaura Murray. Care advise given per protocol.  Reason for Disposition . [1] Continuous (nonstop) coughing interferes with work or school AND [2] no improvement using cough treatment per Care Advice  Answer Assessment - Initial Assessment Questions 1. ONSET: "When did the cough begin?"      Saturday 2. SEVERITY: "How bad is the cough today?"      Moderate, severe last night. 3. RESPIRATORY DISTRESS: "Describe your breathing."      "Head congested" breathing WNL 4. FEVER: "Do you have a fever?" If so, ask: "What is your temperature, how was it measured, and when did it start?"     no 5. SPUTUM: "Describe the color of your sputum" (clear, white, yellow, green)      Thick, dark tan 6. HEMOPTYSIS: "Are you coughing up any blood?" If so ask: "How much?" (flecks, streaks, tablespoons, etc.)     no 7. CARDIAC HISTORY: "Do you have any history of heart disease?" (e.g., heart attack, congestive heart failure)      no 8. LUNG HISTORY: "Do you have any history of lung disease?"  (e.g., pulmonary embolus, asthma, emphysema)     no 9. PE RISK FACTORS: "Do you have a history of blood clots?" (or: recent major surgery, recent prolonged travel, bedridden)     no 10. OTHER SYMPTOMS: "Do you have any other symptoms?" (e.g., runny nose, wheezing, chest pain)      Runny nose, sore throat, red.  Protocols used: COUGH - ACUTE PRODUCTIVE-A-AH

## 2018-02-05 ENCOUNTER — Ambulatory Visit: Payer: BC Managed Care – PPO | Admitting: Podiatry

## 2018-02-05 ENCOUNTER — Encounter: Payer: Self-pay | Admitting: Podiatry

## 2018-02-05 ENCOUNTER — Ambulatory Visit (INDEPENDENT_AMBULATORY_CARE_PROVIDER_SITE_OTHER): Payer: BC Managed Care – PPO

## 2018-02-05 ENCOUNTER — Other Ambulatory Visit: Payer: Self-pay | Admitting: Podiatry

## 2018-02-05 DIAGNOSIS — M779 Enthesopathy, unspecified: Secondary | ICD-10-CM | POA: Diagnosis not present

## 2018-02-05 DIAGNOSIS — M778 Other enthesopathies, not elsewhere classified: Secondary | ICD-10-CM

## 2018-02-05 DIAGNOSIS — B351 Tinea unguium: Secondary | ICD-10-CM

## 2018-02-05 DIAGNOSIS — Z79899 Other long term (current) drug therapy: Secondary | ICD-10-CM

## 2018-02-05 DIAGNOSIS — M79671 Pain in right foot: Secondary | ICD-10-CM

## 2018-02-08 NOTE — Progress Notes (Signed)
   Subjective: 58 year old female presenting today as a new patient with a chief complaint of possible fungus to the bilateral great toenails that has been ongoing for the past several months. She states the left hallux is worse than the right and that the nail is lifting from the nailbed. She has not done anything for treatment and denies modifying factors.  She also notes a painful callus lesion to the ball of the right foot that has been present off and on for the past several months. Walking and applying pressure to the area increases the pain. She has not had any recent treatment for the symptoms. Patient is here for further evaluation and treatment.   Past Medical History:  Diagnosis Date  . Allergic rhinitis    seasonal  . Hyperlipidemia   . Multinodular goiter (nontoxic)   . Osteopenia 10/2016   T score -2.3 FRAX 9%/1.4%  . Ovarian cyst    PMH of  . Seizure disorder (HCC)    Dr Sandria Manly    Objective: Physical Exam General: The patient is alert and oriented x3 in no acute distress.  Dermatology: Hyperkeratotic, discolored, thickened, onychodystrophy of bilateral great toenails noted. Hyperkeratotic lesion present on the right foot x 1. Pain on palpation with a central nucleated core noted. Skin is warm, dry and supple bilateral lower extremities. Negative for open lesions or macerations.  Vascular: Palpable pedal pulses bilaterally. No edema or erythema noted. Capillary refill within normal limits.  Neurological: Epicritic and protective threshold grossly intact bilaterally.   Musculoskeletal Exam: Range of motion within normal limits to all pedal and ankle joints bilateral. Muscle strength 5/5 in all groups bilateral.   Assessment: #1 onychomycosis bilateral great toenails  #2 Porokeratosis right foot x 1  Plan of Care:  #1 Patient was evaluated. #2 Orders for liver function tests were placed today. I will call patient with results.  #3 Prescription for Lamisil 250 mg #90  provided to patient.  #4 Recommended Urea 40% cream.  #5 Excisional debridement of keratotic lesion using a chisel blade was performed without incident. Light dressing applied.  #6 Return to clinic as needed.    Felecia Shelling, DPM [facility]  Dr. Felecia Shelling, DPM    [address]                                        [place], [place] [address]                Office [phone]  Fax [phone]

## 2018-02-20 ENCOUNTER — Telehealth: Payer: Self-pay | Admitting: Podiatry

## 2018-02-20 NOTE — Telephone Encounter (Signed)
I'm being sent to [facility] for the liver function test. I need to confirm the code for that so that insurance and that clinic have that same information.

## 2018-02-20 NOTE — Telephone Encounter (Signed)
Left message informing pt our office used Z79.899 for labs prior to medication use.

## 2018-03-04 ENCOUNTER — Other Ambulatory Visit: Payer: Self-pay | Admitting: Neurology

## 2018-06-02 ENCOUNTER — Other Ambulatory Visit: Payer: Self-pay | Admitting: Neurology

## 2018-06-06 ENCOUNTER — Other Ambulatory Visit: Payer: Self-pay

## 2018-06-06 ENCOUNTER — Ambulatory Visit (INDEPENDENT_AMBULATORY_CARE_PROVIDER_SITE_OTHER): Payer: BC Managed Care – PPO | Admitting: Neurology

## 2018-06-06 ENCOUNTER — Encounter: Payer: Self-pay | Admitting: Neurology

## 2018-06-06 DIAGNOSIS — G40309 Generalized idiopathic epilepsy and epileptic syndromes, not intractable, without status epilepticus: Secondary | ICD-10-CM | POA: Diagnosis not present

## 2018-06-06 MED ORDER — LAMOTRIGINE 100 MG PO TABS: 100.0000 mg | ORAL_TABLET | Freq: Two times a day (BID) | ORAL | 4 refills | Status: DC

## 2018-06-06 NOTE — Progress Notes (Signed)
PATIENT: Danielle Gamble DOB: 1960/06/29  REASON FOR VISIT: follow up- Seizures HISTORY FROM: patient  HISTORY OF PRESENT ILLNESS:  Danielle Gamble a 58 years old right-handed Caucasian female, previously patient of Dr. Sandria Manly,  I saw her initially on March 17th 2015.  She had a history of seizure, first one was in 1995, no warning signs, generalized tonic-clonic seizure, with tongue biting, incontinence, a second seizure was in 1997, generalized, mild body shaking,  MRI of the brain in 1997 was normal, EEG was normal,  She was initially treated with Dilantin, has been on current dose of lamotrigine since 2011, 100 mg tablet, half tablet in the morning, 1 tablet every night, no side effect, doing very well, still driving, work full time as a Hospital doctor,   UPDATE Jan 18 2017: She had a seizure in August 2018, lamotrigine dose was increased to 100 mg twice a day, laboratory evaluations in August 2018, lamotrigine level 7.5, normal CBC, CMP showed mild elevated calcium 10.4.  She still works as Runner, broadcasting/film/video at school, she only has very vague memory of the event on August 11th 2018, woke up in the kitchen inside, tongue biting, confused, nauseous,   Virtual Visit via Video  I connected with Danielle Gamble on 06/06/18 at  by Video and verified that I am speaking with the correct person using two identifiers.   I discussed the limitations, risks, security and privacy concerns of performing an evaluation and management service by video and the availability of in person appointments. I also discussed with the patient that there may be a patient responsible charge related to this service. The patient expressed understanding and agreed to proceed.  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 Awake alert oriented to history taking care of conversation, no gait abnormality  Assessment and Plan: Epilepsy  Last seizure was on September 10, 2016  Refill her lamotrigine 100 mg twice a day  Call office for recurrent seizure  Follow Up Instructions:   In 1 year   I discussed the assessment and treatment plan with the patient. The patient was provided an opportunity to ask questions and all were answered. The patient agreed with the plan and demonstrated an understanding of the instructions.   The patient was advised to call back or seek an in-person evaluation if the symptoms worsen or if the condition fails to improve as anticipated.  I provided 10 minutes of non-face-to-face time during this encounter.   Levert Feinstein, MD   REVIEW OF SYSTEMS: Out of a complete 14 system review of symptoms, the patient complains only of the following symptoms, and all other reviewed systems are negative. Seizure  ALLERGIES: No Known Allergies  HOME MEDICATIONS: Outpatient Medications Prior to Visit  Medication Sig Dispense Refill  . aspirin 81 MG tablet Take 81 mg by mouth daily.      . Calcium Carbonate-Vit D-Min (CALCIUM 1200 PO) Take by mouth daily.      . cefdinir (OMNICEF) 300 MG capsule Take 1 capsule (300 mg total) by mouth 2 (two) times daily. 20 capsule 0  . guaiFENesin-codeine 100-10 MG/5ML syrup Take 5 mLs by mouth every 6 (six) hours as needed for cough. 120 mL 0  . lamoTRIgine (LAMICTAL) 100 MG tablet Take  1 tablet (100 mg total) by mouth 2 (two) times daily. Please call [phone] to schedule follow up appt. 180 tablet 0  . Multiple Vitamin (MULTIVITAMIN) tablet Take 1 tablet by mouth daily.       No facility-administered medications prior to visit.     PAST MEDICAL HISTORY: Past Medical History:  Diagnosis Date  . Allergic rhinitis    seasonal  . Hyperlipidemia   . Multinodular goiter  (nontoxic)   . Osteopenia 10/2016   T score -2.3 FRAX 9%/1.4%  . Ovarian cyst    PMH of  . Seizure disorder (HCC)    Dr Sandria Manly    PAST SURGICAL HISTORY: Past Surgical History:  Procedure Laterality Date  . ABDOMINAL SURGERY     Expl. Laparotomy-Right ovar.cyst  . BIOPSY THYROID  2015   Dr Elvera Lennox  . COLONOSCOPY  2012   negative, F/U in 2022([facility])  . OVARIAN CYST SURGERY     done in [facility]  . Sternal Cystectomy      FAMILY HISTORY: Family History  Problem Relation Age of Onset  . Prostate cancer Father   . Heart attack Father 48       CBAG; pacer  . Stroke Father 35  . Alzheimer's disease Mother   . Coronary artery disease Paternal Uncle     SOCIAL HISTORY: Social History   Socioeconomic History  . Marital status: Married    Spouse name: [name]  . Number of children: 0  . Years of education: college  . Highest education level: Not on file  Occupational History    Employer: [employer]  Social Needs  . Financial resource strain: Not on file  . Food insecurity:    Worry: Not on file    Inability: Not on file  . Transportation needs:    Medical: Not on file    Non-medical: Not on file  Tobacco Use  . Smoking status: Never Smoker  . Smokeless tobacco: Never Used  Substance and Sexual Activity  . Alcohol use: Yes    Alcohol/week: 1.0 standard drinks    Types: 1 Glasses of wine per week    Comment: socially on weekends  . Drug use: No  . Sexual activity: Yes    Birth control/protection: Other-see comments    Comment: VASECTOMY-1st intercourse 58 yo-Fewer than 5 partners  Lifestyle  . Physical activity:    Days per week: Not on file    Minutes per session: Not on file  . Stress: Not on file  Relationships  . Social connections:    Talks on phone: Not on file    Gets together: Not on file    Attends religious service: Not on file    Active member of club or organization: Not on file    Attends meetings of clubs or  organizations: Not on file    Relationship status: Not on file  . Intimate partner violence:    Fear of current or ex partner: Not on file    Emotionally abused: Not on file    Physically abused: Not on file    Forced sexual activity: Not on file  Other Topics Concern  . Not on file  Social History Narrative   Patient  Lives at home with her husband [name]) [name]    Patient works full time at [employer]   Right handed   Caffeine two cups daily      PHYSICAL EXAM  There were no vitals filed for this visit. There  is no height or weight on file to calculate BMI.  Generalized: Well developed, in no acute distress   Neurological examination  Mentation: Alert oriented to time, place, history taking. Follows all commands speech and language fluent Cranial nerve II-XII: Pupils were equal round reactive to light. Extraocular movements were full, visual field were full on confrontational test. Facial sensation and strength were normal. Uvula tongue midline. Head turning and shoulder shrug  were normal and symmetric. Motor: The motor testing reveals 5 over 5 strength of all 4 extremities. Good symmetric motor tone is noted throughout.  Sensory: Sensory testing is intact to soft touch on all 4 extremities. No evidence of extinction is noted.  Coordination: Cerebellar testing reveals good finger-nose-finger and heel-to-shin bilaterally.  Gait and station: Gait is normal. Tandem gait is normal. Romberg is negative. No drift is seen.  Reflexes: Deep tendon reflexes are symmetric and normal bilaterally.   DIAGNOSTIC DATA (LABS, IMAGING, TESTING) - I reviewed patient records, labs, notes, testing and imaging myself where available.  Lab Results  Component Value Date   WBC 6.1 09/13/2016   HGB 14.5 09/13/2016   HCT 44.7 09/13/2016   MCV 96 09/13/2016   PLT 217 09/13/2016      Component Value Date/Time   NA 142 09/13/2016 1115   K 4.9 09/13/2016 1115   CL 103  09/13/2016 1115   CO2 25 09/13/2016 1115   GLUCOSE 91 09/13/2016 1115   GLUCOSE 87 11/15/2011 1043   BUN 14 09/13/2016 1115   CREATININE 0.97 09/13/2016 1115   CALCIUM 10.4 (H) 09/13/2016 1115   PROT 7.3 09/13/2016 1115   ALBUMIN 4.7 09/13/2016 1115   AST 22 09/13/2016 1115   ALT 22 09/13/2016 1115   ALKPHOS 90 09/13/2016 1115   BILITOT 0.2 09/13/2016 1115   GFRNONAA 65 09/13/2016 1115   GFRAA 75 09/13/2016 1115   Lab Results  Component Value Date   CHOL 200 (H) 05/23/2014   HDL 63 05/23/2014   LDLCALC 103 (H) 05/23/2014   LDLDIRECT 111.9 11/13/2012   TRIG 169 (H) 05/23/2014   CHOLHDL 4 11/13/2012    Lab Results  Component Value Date   TSH 1.36 09/10/2014      ASSESSMENT AND PLAN 58 y.o. year old female    Epilepsy:  Most recurrent seizure was in August 2018  Continue lamotrigine 100 mg twice a day.      PATIENT: Danielle Gamble DOB: 05/15/1960  No chief complaint on file.    HISTORICAL  Danielle Lemonsoberta R Campton, seen in refer by    REVIEW OF SYSTEMS: Full 14 system review of systems performed and notable only for as above  ALLERGIES: No Known Allergies  HOME MEDICATIONS: Current Outpatient Medications  Medication Sig Dispense Refill  . aspirin 81 MG tablet Take 81 mg by mouth daily.      . Calcium Carbonate-Vit D-Min (CALCIUM 1200 PO) Take by mouth daily.      . cefdinir (OMNICEF) 300 MG capsule Take 1 capsule (300 mg total) by mouth 2 (two) times daily. 20 capsule 0  . guaiFENesin-codeine 100-10 MG/5ML syrup Take 5 mLs by mouth every 6 (six) hours as needed for cough. 120 mL 0  . lamoTRIgine (LAMICTAL) 100 MG tablet Take 1 tablet (100 mg total) by mouth 2 (two) times daily. Please call [phone] to schedule follow up appt. 180 tablet 0  . Multiple Vitamin (MULTIVITAMIN) tablet Take 1 tablet by mouth daily.       No current facility-administered medications for  this visit.     PAST MEDICAL HISTORY: Past Medical History:  Diagnosis Date  . Allergic  rhinitis    seasonal  . Hyperlipidemia   . Multinodular goiter (nontoxic)   . Osteopenia 10/2016   T score -2.3 FRAX 9%/1.4%  . Ovarian cyst    PMH of  . Seizure disorder (HCC)    Dr Sandria Manly    PAST SURGICAL HISTORY: Past Surgical History:  Procedure Laterality Date  . ABDOMINAL SURGERY     Expl. Laparotomy-Right ovar.cyst  . BIOPSY THYROID  2015   Dr Elvera Lennox  . COLONOSCOPY  2012   negative, F/U in 2022([facility])  . OVARIAN CYST SURGERY     done in [facility]  . Sternal Cystectomy      FAMILY HISTORY: Family History  Problem Relation Age of Onset  . Prostate cancer Father   . Heart attack Father 31       CBAG; pacer  . Stroke Father 71  . Alzheimer's disease Mother   . Coronary artery disease Paternal Uncle     SOCIAL HISTORY:  Social History   Socioeconomic History  . Marital status: Married    Spouse name: [name]  . Number of children: 0  . Years of education: college  . Highest education level: Not on file  Occupational History    Employer: [employer]  Social Needs  . Financial resource strain: Not on file  . Food insecurity:    Worry: Not on file    Inability: Not on file  . Transportation needs:    Medical: Not on file    Non-medical: Not on file  Tobacco Use  . Smoking status: Never Smoker  . Smokeless tobacco: Never Used  Substance and Sexual Activity  . Alcohol use: Yes    Alcohol/week: 1.0 standard drinks    Types: 1 Glasses of wine per week    Comment: socially on weekends  . Drug use: No  . Sexual activity: Yes    Birth control/protection: Other-see comments    Comment: VASECTOMY-1st intercourse 58 yo-Fewer than 5 partners  Lifestyle  . Physical activity:    Days per week: Not on file    Minutes per session: Not on file  . Stress: Not on file  Relationships  . Social connections:    Talks on phone: Not on file    Gets together: Not on file    Attends religious service: Not on file    Active member of club or  organization: Not on file    Attends meetings of clubs or organizations: Not on file    Relationship status: Not on file  . Intimate partner violence:    Fear of current or ex partner: Not on file    Emotionally abused: Not on file    Physically abused: Not on file    Forced sexual activity: Not on file  Other Topics Concern  . Not on file  Social History Narrative   Patient  Lives at home with her husband [name]) [name]    Patient works full time at [employer]   Right handed   Caffeine two cups daily     PHYSICAL EXAM   There were no vitals filed for this visit.  Not recorded      There is no height or weight on file to calculate BMI.  PHYSICAL EXAMNIATION:  Gen: NAD, conversant, well nourised, obese, well groomed  Cardiovascular: Regular rate rhythm, no peripheral edema, warm, nontender. Eyes: Conjunctivae clear without exudates or hemorrhage Neck: Supple, no carotid bruits. Pulmonary: Clear to auscultation bilaterally   NEUROLOGICAL EXAM:  MENTAL STATUS: Speech:    Speech is normal; fluent and spontaneous with normal comprehension.  Cognition:     Orientation to time, place and person     Normal recent and remote memory     Normal Attention span and concentration     Normal Language, naming, repeating,spontaneous speech     Fund of knowledge   CRANIAL NERVES: CN II: Visual fields are full to confrontation. Fundoscopic exam is normal with sharp discs and no vascular changes. Pupils are round equal and briskly reactive to light. CN III, IV, VI: extraocular movement are normal. No ptosis. CN V: Facial sensation is intact to pinprick in all 3 divisions bilaterally. Corneal responses are intact.  CN VII: Face is symmetric with normal eye closure and smile. CN VIII: Hearing is normal to rubbing fingers CN IX, X: Palate elevates symmetrically. Phonation is normal. CN XI: Head turning and shoulder shrug are intact CN XII: Tongue is  midline with normal movements and no atrophy.  MOTOR: There is no pronator drift of out-stretched arms. Muscle bulk and tone are normal. Muscle strength is normal.  REFLEXES: Reflexes are 2+ and symmetric at the biceps, triceps, knees, and ankles. Plantar responses are flexor.  SENSORY: Intact to light touch, pinprick, positional sensation and vibratory sensation are intact in fingers and toes.  COORDINATION: Rapid alternating movements and fine finger movements are intact. There is no dysmetria on finger-to-nose and heel-knee-shin.    GAIT/STANCE: Posture is normal. Gait is steady with normal steps, base, arm swing, and turning. Heel and toe walking are normal. Tandem gait is normal.  Romberg is absent.   DIAGNOSTIC DATA (LABS, IMAGING, TESTING) - I reviewed patient records, labs, notes, testing and imaging myself where available.   ASSESSMENT AND PLAN  Danielle Gamble is a 58 y.o. female      Levert Feinstein, M.D. Ph.D.  [facility] [address] [place], [place] [address] Ph: [phone] Fax: [phone]  CC: Referring Provider

## 2018-10-22 ENCOUNTER — Encounter: Payer: Self-pay | Admitting: Gynecology

## 2018-10-23 ENCOUNTER — Encounter: Payer: Self-pay | Admitting: Gynecology

## 2018-10-25 ENCOUNTER — Encounter: Payer: Self-pay | Admitting: Gynecology

## 2018-10-28 ENCOUNTER — Other Ambulatory Visit: Payer: Self-pay | Admitting: Internal Medicine

## 2018-10-30 ENCOUNTER — Encounter: Payer: Self-pay | Admitting: Gynecology

## 2018-11-01 ENCOUNTER — Encounter: Payer: BC Managed Care – PPO | Admitting: Gynecology

## 2018-11-08 NOTE — Telephone Encounter (Signed)
My chart message came back unread, patient informed.  

## 2018-11-13 ENCOUNTER — Other Ambulatory Visit: Payer: Self-pay

## 2018-11-14 ENCOUNTER — Encounter: Payer: Self-pay | Admitting: Gynecology

## 2018-11-14 ENCOUNTER — Ambulatory Visit (INDEPENDENT_AMBULATORY_CARE_PROVIDER_SITE_OTHER): Payer: BC Managed Care – PPO | Admitting: Gynecology

## 2018-11-14 VITALS — BP 118/78 | Ht 69.0 in | Wt 171.0 lb

## 2018-11-14 DIAGNOSIS — Z01419 Encounter for gynecological examination (general) (routine) without abnormal findings: Secondary | ICD-10-CM | POA: Diagnosis not present

## 2018-11-14 DIAGNOSIS — N952 Postmenopausal atrophic vaginitis: Secondary | ICD-10-CM | POA: Diagnosis not present

## 2018-11-14 DIAGNOSIS — M8580 Other specified disorders of bone density and structure, unspecified site: Secondary | ICD-10-CM

## 2018-11-14 DIAGNOSIS — Z23 Encounter for immunization: Secondary | ICD-10-CM

## 2018-11-14 NOTE — Progress Notes (Signed)
    Danielle Gamble [birth year] [id number]        58 y.o.  [name] for annual gynecologic exam.  Without gynecologic complaints  Past medical history,surgical history, problem list, medications, allergies, family history and social history were all reviewed and documented as reviewed in the EPIC chart.  ROS:  Performed with pertinent positives and negatives included in the history, assessment and plan.   Additional significant findings : None   Exam: Caryn Bee assistant Vitals:   11/14/18 1528  BP: 118/78  Weight: 171 lb (77.6 kg)  Height: 5\' 9"  (1.753 m)   Body mass index is 25.25 kg/m.  General appearance:  Normal affect, orientation and appearance. Skin: Grossly normal HEENT: Without gross lesions.  No cervical or supraclavicular adenopathy. Thyroid normal.  Lungs:  Clear without wheezing, rales or rhonchi Cardiac: RR, without RMG Abdominal:  Soft, nontender, without masses, guarding, rebound, organomegaly or hernia Breasts:  Examined lying and sitting without masses, retractions, discharge or axillary adenopathy. Pelvic:  Ext, BUS, Vagina: With atrophic changes  Cervix: With atrophic changes  Uterus: Anteverted, normal size, shape and contour, midline and mobile nontender   Adnexa: Without masses or tenderness    Anus and perineum: Normal   Rectovaginal: Normal sphincter tone without palpated masses or tenderness.    Assessment/Plan:  58 y.o. [name] female for annual gynecologic exam.   1. Postmenopausal.  No significant menopausal symptoms or any bleeding. 2. Osteopenia.  DEXA 10/2018 T score -1.9 FRAX 8.7% / 0.9% overall stable from prior DEXA.  Plan repeat DEXA at 2-year interval. 3. Mammography 10/2018.  Continue with annual mammography when due.  Breast exam normal today. 4. Colonoscopy 2013.  Repeat at their recommended interval. 5. Pap smear/HPV 2019.  No Pap smear done today.  No history of significant abnormal Pap smears.  Plan repeat Pap smear/HPV at 5-year  interval per current screening guidelines. 6. Health maintenance.  No routine lab work done as patient does this elsewhere.  Follow-up 1 year, sooner as needed.   Anastasio Auerbach MD, 3:52 PM 11/14/2018

## 2018-11-14 NOTE — Patient Instructions (Signed)
Follow-up in 1 year for annual exam, sooner if any issues. 

## 2018-11-20 DIAGNOSIS — Z0279 Encounter for issue of other medical certificate: Secondary | ICD-10-CM

## 2019-05-20 ENCOUNTER — Ambulatory Visit: Payer: BC Managed Care – PPO | Admitting: Podiatry

## 2019-05-20 ENCOUNTER — Other Ambulatory Visit: Payer: Self-pay

## 2019-05-20 VITALS — Temp 97.6°F

## 2019-05-20 DIAGNOSIS — M79674 Pain in right toe(s): Secondary | ICD-10-CM | POA: Diagnosis not present

## 2019-05-20 DIAGNOSIS — B351 Tinea unguium: Secondary | ICD-10-CM | POA: Diagnosis not present

## 2019-05-20 DIAGNOSIS — M79675 Pain in left toe(s): Secondary | ICD-10-CM

## 2019-05-20 DIAGNOSIS — L989 Disorder of the skin and subcutaneous tissue, unspecified: Secondary | ICD-10-CM

## 2019-05-20 MED ORDER — TERBINAFINE HCL 250 MG PO TABS: 250.0000 mg | ORAL_TABLET | Freq: Every day | ORAL | 0 refills | Status: DC

## 2019-05-21 NOTE — Patient Instructions (Addendum)
Monitor your BP at home - it should be less than 140/90.  Blood work was ordered.    All other Health Maintenance issues reviewed.   All recommended immunizations and age-appropriate screenings are up-to-date or discussed.  No immunization administered today.   Medications reviewed and updated.  Changes include :  Trazodone at night for sleep   Please followup in 1 year    Health Maintenance, Female Adopting a healthy lifestyle and getting preventive care are important in promoting health and wellness. Ask your health care provider about:  The right schedule for you to have regular tests and exams.  Things you can do on your own to prevent diseases and keep yourself healthy. What should I know about diet, weight, and exercise? Eat a healthy diet   Eat a diet that includes plenty of vegetables, fruits, low-fat dairy products, and lean protein.  Do not eat a lot of foods that are high in solid fats, added sugars, or sodium. Maintain a healthy weight Body mass index (BMI) is used to identify weight problems. It estimates body fat based on height and weight. Your health care provider can help determine your BMI and help you achieve or maintain a healthy weight. Get regular exercise Get regular exercise. This is one of the most important things you can do for your health. Most adults should:  Exercise for at least 150 minutes each week. The exercise should increase your heart rate and make you sweat (moderate-intensity exercise).  Do strengthening exercises at least twice a week. This is in addition to the moderate-intensity exercise.  Spend less time sitting. Even light physical activity can be beneficial. Watch cholesterol and blood lipids Have your blood tested for lipids and cholesterol at 61 years of age, then have this test every 5 years. Have your cholesterol levels checked more often if:  Your lipid or cholesterol levels are high.  You are older than 61 years of  age.  You are at high risk for heart disease. What should I know about cancer screening? Depending on your health history and family history, you may need to have cancer screening at various ages. This may include screening for:  Breast cancer.  Cervical cancer.  Colorectal cancer.  Skin cancer.  Lung cancer. What should I know about heart disease, diabetes, and high blood pressure? Blood pressure and heart disease  High blood pressure causes heart disease and increases the risk of stroke. This is more likely to develop in people who have high blood pressure readings, are of African descent, or are overweight.  Have your blood pressure checked: ? Every 3-5 years if you are 23-32 years of age. ? Every year if you are 33 years old or older. Diabetes Have regular diabetes screenings. This checks your fasting blood sugar level. Have the screening done:  Once every three years after age 62 if you are at a normal weight and have a low risk for diabetes.  More often and at a younger age if you are overweight or have a high risk for diabetes. What should I know about preventing infection? Hepatitis B If you have a higher risk for hepatitis B, you should be screened for this virus. Talk with your health care provider to find out if you are at risk for hepatitis B infection. Hepatitis C Testing is recommended for:  Everyone born from [birth year] through 1965.  Anyone with known risk factors for hepatitis C. Sexually transmitted infections (STIs)  Get screened for STIs, including gonorrhea  and chlamydia, if: ? You are sexually active and are younger than 61 years of age. ? You are older than 61 years of age and your health care provider tells you that you are at risk for this type of infection. ? Your sexual activity has changed since you were last screened, and you are at increased risk for chlamydia or gonorrhea. Ask your health care provider if you are at risk.  Ask your health care  provider about whether you are at high risk for HIV. Your health care provider may recommend a prescription medicine to help prevent HIV infection. If you choose to take medicine to prevent HIV, you should first get tested for HIV. You should then be tested every 3 months for as long as you are taking the medicine. Pregnancy  If you are about to stop having your period (premenopausal) and you may become pregnant, seek counseling before you get pregnant.  Take 400 to 800 micrograms (mcg) of folic acid every day if you become pregnant.  Ask for birth control (contraception) if you want to prevent pregnancy. Osteoporosis and menopause Osteoporosis is a disease in which the bones lose minerals and strength with aging. This can result in bone fractures. If you are 52 years old or older, or if you are at risk for osteoporosis and fractures, ask your health care provider if you should:  Be screened for bone loss.  Take a calcium or vitamin D supplement to lower your risk of fractures.  Be given hormone replacement therapy (HRT) to treat symptoms of menopause. Follow these instructions at home: Lifestyle  Do not use any products that contain nicotine or tobacco, such as cigarettes, e-cigarettes, and chewing tobacco. If you need help quitting, ask your health care provider.  Do not use street drugs.  Do not share needles.  Ask your health care provider for help if you need support or information about quitting drugs. Alcohol use  Do not drink alcohol if: ? Your health care provider tells you not to drink. ? You are pregnant, may be pregnant, or are planning to become pregnant.  If you drink alcohol: ? Limit how much you use to 0-1 drink a day. ? Limit intake if you are breastfeeding.  Be aware of how much alcohol is in your drink. In the [place] one drink equals one 12 oz bottle of beer (355 mL), one 5 oz glass of wine (148 mL), or one 1 oz glass of hard liquor (44 mL). General  instructions  Schedule regular health, dental, and eye exams.  Stay current with your vaccines.  Tell your health care provider if: ? You often feel depressed. ? You have ever been abused or do not feel safe at home. Summary  Adopting a healthy lifestyle and getting preventive care are important in promoting health and wellness.  Follow your health care provider's instructions about healthy diet, exercising, and getting tested or screened for diseases.  Follow your health care provider's instructions on monitoring your cholesterol and blood pressure. This information is not intended to replace advice given to you by your health care provider. Make sure you discuss any questions you have with your health care provider. Document Revised: 01/10/2018 Document Reviewed: 01/10/2018 Elsevier Patient Education  2020 [employer].

## 2019-05-21 NOTE — Progress Notes (Signed)
Subjective:    Patient ID: Danielle Gamble, female    DOB: [birth year], 59 y.o.   MRN: [id number]  HPI She is here for a physical exam.   Her mom has Alzheimer's and she has been helping care for her.    Beginning to have tingling in b/l feet and calves when she lays down. Occasionally it comes above the knee.  It is relatively new.  It is not constant.  She is concerned about diabetes.  She denies tingling in her hands and has not noticed it during the day. She denies back pain.   She is concerned about her cholesterol.  She has been on a statin in the past and was concerned about side effects, so stopped it.  If she needs it she would consider taking it.  Sleep is not great - she has been taking melatonin.  This helps relax her, but she is not able to get back to sleep at times in the middle of the night.     Medications and allergies reviewed with patient and updated if appropriate.  Patient Active Problem List   Diagnosis Date Noted  . 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 Callus of foot 09/12/2012  . Onychomycosis 09/12/2012  . 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 Porokeratosis 09/12/2012  . Fissure in skin of foot 09/12/2012  . Multinodular goiter 11/29/2011  . Ovarian cyst   . Osteopenia 09/10/2010  . Hyperlipidemia 09/01/2009  . ALLERGIC RHINITIS 06/07/2008    Current Outpatient Medications on File Prior to Visit  Medication Sig Dispense Refill  . aspirin 81 MG tablet Take 81 mg by mouth daily.      . Calcium Carbonate-Vit D-Min (CALCIUM 1200 PO) Take by mouth daily.      Marland Kitchen lamoTRIgine (LAMICTAL) 100 MG tablet Take 1 tablet (100 mg total) by mouth 2 (two) times daily. Please call [phone] to schedule follow up appt. 180 tablet 4  . terbinafine (LAMISIL) 250 MG tablet Take 1 tablet (250 mg total) by mouth daily. (Patient not taking: Reported on 05/22/2019) 90 tablet 0   No current facility-administered medications on file prior to visit.    Past  Medical History:  Diagnosis Date  . Allergic rhinitis    seasonal  . Hyperlipidemia   . Multinodular goiter (nontoxic)   . Osteopenia 10/2018   T score -1.9 FRAX 8.7% / 0.9%  . Ovarian cyst    PMH of  . Seizure disorder [facility])    Dr Sandria Manly    Past Surgical History:  Procedure Laterality Date  . ABDOMINAL SURGERY     Expl. Laparotomy-Right ovar.cyst  . BIOPSY THYROID  2015   Dr Elvera Lennox  . COLONOSCOPY  2012   negative, F/U in 2022([facility])  . OVARIAN CYST SURGERY     done in [facility]  . Sternal Cystectomy      Social History   Socioeconomic History  . Marital status: Married    Spouse name: [name]  . Number of children: 0  . Years of education: college  . Highest education level: Not on file  Occupational History    Employer: [employer]  Tobacco Use  . Smoking status: Never Smoker  . Smokeless tobacco: Never Used  Substance and Sexual Activity  . Alcohol use: Yes    Alcohol/week: 1.0 standard drinks    Types: 1 Glasses of wine per week    Comment: socially on weekends  . Drug use: No  . Sexual activity: Not Currently  Birth control/protection: Other-see comments    Comment: VASECTOMY-1st intercourse 59 yo-Fewer than 5 partners  Other Topics Concern  . Not on file  Social History Narrative   Patient  Lives at home with her husband [name]) [name]    Patient works full time at [employer]   Right handed   Caffeine two cups daily   Social Determinants of Health   Financial Resource Strain:   . Difficulty of Paying Living Expenses:   Food Insecurity:   . Worried About Programme researcher, broadcasting/film/video in the Last Year:   . Barista in the Last Year:   Transportation Needs:   . Freight forwarder (Medical):   Marland Kitchen Lack of Transportation (Non-Medical):   Physical Activity:   . Days of Exercise per Week:   . Minutes of Exercise per Session:   Stress:   . Feeling of Stress :   Social Connections:   . Frequency of  Communication with Friends and Family:   . Frequency of Social Gatherings with Friends and Family:   . Attends Religious Services:   . Active Member of Clubs or Organizations:   . Attends Banker Meetings:   Marland Kitchen Marital Status:     Family History  Problem Relation Age of Onset  . Prostate cancer Father   . Heart attack Father 68       CBAG; pacer  . Stroke Father 61  . Alzheimer's disease Mother   . Coronary artery disease Paternal Uncle     Review of Systems  Constitutional: Negative for chills and fever.  Eyes: Negative for visual disturbance.  Respiratory: Negative for cough, shortness of breath and wheezing.   Cardiovascular: Negative for chest pain, palpitations and leg swelling.  Gastrointestinal: Negative for abdominal pain, blood in stool, constipation, diarrhea and nausea.       No gerd  Endocrine: Negative for polydipsia and polyuria.  Genitourinary: Negative for dysuria and hematuria.  Musculoskeletal: Negative for arthralgias and back pain.  Skin: Negative for color change and rash.  Neurological: Negative for dizziness, light-headedness, numbness (tingling in feet and legs when laying) and headaches.  Psychiatric/Behavioral: Negative for dysphoric mood. The patient is not nervous/anxious.        Objective:   Vitals:   05/22/19 0824  BP: (!) 154/86  Pulse: 85  Resp: 16  Temp: 98.7 F (37.1 C)  SpO2: 98%   Filed Weights   05/22/19 0824  Weight: 168 lb (76.2 kg)   Body mass index is 24.81 kg/m.  BP Readings from Last 3 Encounters:  05/22/19 (!) 154/86  11/14/18 118/78  01/15/18 120/78    Wt Readings from Last 3 Encounters:  05/22/19 168 lb (76.2 kg)  11/14/18 171 lb (77.6 kg)  01/15/18 172 lb (78 kg)     Physical Exam Constitutional: She appears well-developed and well-nourished. No distress.  HENT:  Head: Normocephalic and atraumatic.  Right Ear: External ear normal. Normal ear canal and TM Left Ear: External ear normal.   Normal ear canal and TM Mouth/Throat: Oropharynx is clear and moist.  Eyes: Conjunctivae and EOM are normal.  Neck: Neck supple. No tracheal deviation present. No thyromegaly present.  No carotid bruit  Cardiovascular: Normal rate, regular rhythm and normal heart sounds.   No murmur heard.  No edema. Pulmonary/Chest: Effort normal and breath sounds normal. No respiratory distress. She has no wheezes. She has no rales.  Breast: deferred   Abdominal: Soft. She exhibits no distension. There is  no tenderness.  Lymphadenopathy: She has no cervical adenopathy.  Skin: Skin is warm and dry. She is not diaphoretic.  Psychiatric: She has a normal mood and affect. Her behavior is normal.        Assessment & Plan:   Physical exam: Screening blood work    ordered Immunizations  Had covid, all up to date Colonoscopy  Up to date  Mammogram  Up to date  Gyn  Up to date  Dexa  Had one with gyn - has osteopenia Eye exams  Up to date  Exercise   Limited exercise  Weight  BMI normal Substance abuse  none  See Problem List for Assessment and Plan of chronic medical problems.      This visit occurred during the SARS-CoV-2 public health emergency.  Safety protocols were in place, including screening questions prior to the visit, additional usage of staff PPE, and extensive cleaning of exam room while observing appropriate contact time as indicated for disinfecting solutions.

## 2019-05-22 ENCOUNTER — Other Ambulatory Visit: Payer: Self-pay

## 2019-05-22 ENCOUNTER — Ambulatory Visit (INDEPENDENT_AMBULATORY_CARE_PROVIDER_SITE_OTHER): Payer: BC Managed Care – PPO | Admitting: Internal Medicine

## 2019-05-22 ENCOUNTER — Encounter: Payer: Self-pay | Admitting: Internal Medicine

## 2019-05-22 VITALS — BP 154/86 | HR 85 | Temp 98.7°F | Resp 16 | Ht 69.0 in | Wt 168.0 lb

## 2019-05-22 DIAGNOSIS — M8580 Other specified disorders of bone density and structure, unspecified site: Secondary | ICD-10-CM

## 2019-05-22 DIAGNOSIS — Z0000 Encounter for general adult medical examination without abnormal findings: Secondary | ICD-10-CM | POA: Diagnosis not present

## 2019-05-22 DIAGNOSIS — E7849 Other hyperlipidemia: Secondary | ICD-10-CM | POA: Diagnosis not present

## 2019-05-22 DIAGNOSIS — R202 Paresthesia of skin: Secondary | ICD-10-CM | POA: Diagnosis not present

## 2019-05-22 DIAGNOSIS — G40309 Generalized idiopathic epilepsy and epileptic syndromes, not intractable, without status epilepticus: Secondary | ICD-10-CM | POA: Diagnosis not present

## 2019-05-22 DIAGNOSIS — G479 Sleep disorder, unspecified: Secondary | ICD-10-CM

## 2019-05-22 LAB — COMPREHENSIVE METABOLIC PANEL
ALT: 14 U/L (ref 0–35)
AST: 18 U/L (ref 0–37)
Albumin: 4.6 g/dL (ref 3.5–5.2)
Alkaline Phosphatase: 86 U/L (ref 39–117)
BUN: 17 mg/dL (ref 6–23)
CO2: 29 mEq/L (ref 19–32)
Calcium: 10.1 mg/dL (ref 8.4–10.5)
Chloride: 103 mEq/L (ref 96–112)
Creatinine, Ser: 0.95 mg/dL (ref 0.40–1.20)
GFR: 59.89 mL/min — ABNORMAL LOW (ref >60.00)
Glucose, Bld: 95 mg/dL (ref 70–99)
Potassium: 4.5 mEq/L (ref 3.5–5.1)
Sodium: 140 mEq/L (ref 135–145)
Total Bilirubin: 0.4 mg/dL (ref 0.2–1.2)
Total Protein: 7.5 g/dL (ref 6.0–8.3)

## 2019-05-22 LAB — CBC WITH DIFFERENTIAL/PLATELET
Basophils Absolute: 0 10*3/uL (ref 0.0–0.1)
Basophils Relative: 0.8 % (ref 0.0–3.0)
Eosinophils Absolute: 0.2 10*3/uL (ref 0.0–0.7)
Eosinophils Relative: 3.9 % (ref 0.0–5.0)
HCT: 44.9 % (ref 36.0–46.0)
Hemoglobin: 15 g/dL (ref 12.0–15.0)
Lymphocytes Relative: 35.7 % (ref 12.0–46.0)
Lymphs Abs: 2.1 10*3/uL (ref 0.7–4.0)
MCHC: 33.4 g/dL (ref 30.0–36.0)
MCV: 96.2 fl (ref 78.0–100.0)
Monocytes Absolute: 0.4 10*3/uL (ref 0.1–1.0)
Monocytes Relative: 6.4 % (ref 3.0–12.0)
Neutro Abs: 3.1 10*3/uL (ref 1.4–7.7)
Neutrophils Relative %: 53.2 % (ref 43.0–77.0)
Platelets: 245 10*3/uL (ref 150.0–400.0)
RBC: 4.67 Mil/uL (ref 3.87–5.11)
RDW: 13.6 % (ref 11.5–15.5)
WBC: 5.8 10*3/uL (ref 4.0–10.5)

## 2019-05-22 LAB — LIPID PANEL
Cholesterol: 306 mg/dL — ABNORMAL HIGH (ref 0–200)
HDL: 53.9 mg/dL (ref >39.00)
Total CHOL/HDL Ratio: 6
Triglycerides: 599 mg/dL — ABNORMAL HIGH (ref 0.0–149.0)

## 2019-05-22 LAB — LDL CHOLESTEROL, DIRECT: Direct LDL: 190 mg/dL

## 2019-05-22 LAB — TSH: TSH: 2.55 u[IU]/mL (ref 0.35–4.50)

## 2019-05-22 LAB — HEMOGLOBIN A1C: Hgb A1c MFr Bld: 5.5 % (ref 4.6–6.5)

## 2019-05-22 LAB — VITAMIN B12: Vitamin B-12: 640 pg/mL (ref 211–911)

## 2019-05-22 MED ORDER — TRAZODONE HCL 50 MG PO TABS: 25.0000 mg | ORAL_TABLET | Freq: Every evening | ORAL | 5 refills | Status: DC | PRN

## 2019-05-22 NOTE — Progress Notes (Signed)
   Subjective: 59 year old female presenting today with a chief complaint of a painful callus lesion noted to the right foot that has been present for the past 1.5 years. She also notes possible nail fungus to the bilateral great toenail that has been ongoing for over a year as well. She was seen in January 2020 and was prescribed Lamisil but did not go for her LFT so never got it filled. She states she has an appointment with her PCP, Dr. Cheryll Cockayne, and will have annual labs done at that time. Patient is here for further evaluation and treatment.   Past Medical History:  Diagnosis Date  . Allergic rhinitis    seasonal  . Hyperlipidemia   . Multinodular goiter (nontoxic)   . Osteopenia 10/2018   T score -1.9 FRAX 8.7% / 0.9%  . Ovarian cyst    PMH of  . Seizure disorder (HCC)    Dr Sandria Manly    Objective: Physical Exam General: The patient is alert and oriented x3 in no acute distress.  Dermatology: Hyperkeratotic, discolored, thickened, onychodystrophy of bilateral great toenails noted. Hyperkeratotic lesion present on the right foot x 1. Pain on palpation with a central nucleated core noted. Skin is warm, dry and supple bilateral lower extremities. Negative for open lesions or macerations.  Vascular: Palpable pedal pulses bilaterally. No edema or erythema noted. Capillary refill within normal limits.  Neurological: Epicritic and protective threshold grossly intact bilaterally.   Musculoskeletal Exam: Range of motion within normal limits to all pedal and ankle joints bilateral. Muscle strength 5/5 in all groups bilateral.   Assessment: #1 onychomycosis bilateral great toenails  #2 Porokeratosis right foot x 1  Plan of Care:  #1 Patient was evaluated. #2 Excisional debridement of keratotic lesion(s) using a chisel blade was performed without incident. Salinocaine applied and light dressing placed. #3 Mechanical debridement of great toenails bilaterally performed using a nail  nipper. Filed with dremel without incident.  #4 Prescription for Lamisil 250 mg #90 provided to patient.  #5 Inquired about liver function and patient declines any known history of issue or problems.  #6 Return to clinic as needed.    Felecia Shelling, DPM [facility]  Dr. Felecia Shelling, DPM    [address]                                        [place], [place] [address]                Office [phone]  Fax [phone]

## 2019-05-22 NOTE — Assessment & Plan Note (Addendum)
Acute on chronic Worse recently related to mom's health/decline Has never been a great sleeper Taking melatonin, but still has difficulty getting back to sleep at night Trial of trazodone 25-50 mg nightly

## 2019-05-22 NOTE — Assessment & Plan Note (Signed)
Chronic Following w/ neuro Stable, controlled On lamictal

## 2019-05-22 NOTE — Assessment & Plan Note (Signed)
Chronic - likely genetic Was on a statin in the past and stopped it due to concerned of the medication - did not have any side effects Check lipid panel, cmp, tsh Regular exercise and healthy diet encouraged

## 2019-05-22 NOTE — Assessment & Plan Note (Signed)
Acute Tingling b/l feet and lower legs - when laying down None in hands, no back pain A1c, B12, cbc, cmp

## 2019-05-22 NOTE — Assessment & Plan Note (Addendum)
Chronic Managed by gyn Taking cal/vitamin d Encouraged increased exercise

## 2019-05-23 ENCOUNTER — Encounter: Payer: Self-pay | Admitting: Internal Medicine

## 2019-05-23 DIAGNOSIS — E7849 Other hyperlipidemia: Secondary | ICD-10-CM

## 2019-05-24 MED ORDER — ROSUVASTATIN CALCIUM 10 MG PO TABS: 10.0000 mg | ORAL_TABLET | Freq: Every day | ORAL | 3 refills | Status: DC

## 2019-08-14 ENCOUNTER — Telehealth: Payer: Self-pay | Admitting: Internal Medicine

## 2019-08-14 NOTE — Telephone Encounter (Signed)
Sent via mychart

## 2019-08-14 NOTE — Telephone Encounter (Addendum)
   Patient requesting a return to work note, took a Covid leave of absence. Patient wants to return .August 1

## 2019-08-15 ENCOUNTER — Encounter: Payer: Self-pay | Admitting: Internal Medicine

## 2019-08-15 ENCOUNTER — Encounter: Payer: Self-pay | Admitting: Podiatry

## 2019-08-19 ENCOUNTER — Ambulatory Visit: Payer: BC Managed Care – PPO | Admitting: Podiatry

## 2019-08-19 ENCOUNTER — Encounter: Payer: Self-pay | Admitting: Internal Medicine

## 2019-08-19 ENCOUNTER — Encounter: Payer: Self-pay | Admitting: Podiatry

## 2019-08-19 ENCOUNTER — Other Ambulatory Visit: Payer: Self-pay

## 2019-08-19 DIAGNOSIS — M79674 Pain in right toe(s): Secondary | ICD-10-CM | POA: Diagnosis not present

## 2019-08-19 DIAGNOSIS — L989 Disorder of the skin and subcutaneous tissue, unspecified: Secondary | ICD-10-CM

## 2019-08-19 DIAGNOSIS — M79675 Pain in left toe(s): Secondary | ICD-10-CM | POA: Diagnosis not present

## 2019-08-19 DIAGNOSIS — B351 Tinea unguium: Secondary | ICD-10-CM | POA: Diagnosis not present

## 2019-08-20 NOTE — Progress Notes (Signed)
   Subjective: 59 year old female presenting today with a chief complaint of a painful callus lesion noted to the right foot that has been present for the past 1.5 years.  Patient is requesting to have the callus lesion debrided today she does notice relief after debridement and trimming of the callus lesion.  She also recently finished prescription of Lamisil 250 mg for 90 days for onychomycosis of toenails.  She was requesting a refill today if appropriate.  Past Medical History:  Diagnosis Date  . Allergic rhinitis    seasonal  . Hyperlipidemia   . Multinodular goiter (nontoxic)   . Osteopenia 10/2018   T score -1.9 FRAX 8.7% / 0.9%  . Ovarian cyst    PMH of  . Seizure disorder (HCC)    Dr Sandria Manly    Objective: Physical Exam General: The patient is alert and oriented x3 in no acute distress.  Dermatology: Hyperkeratotic, discolored, thickened, onychodystrophy of bilateral great toenails noted. Hyperkeratotic lesion present on the right foot x 1. Pain on palpation with a central nucleated core noted. Skin is warm, dry and supple bilateral lower extremities. Negative for open lesions or macerations.  Vascular: Palpable pedal pulses bilaterally. No edema or erythema noted. Capillary refill within normal limits.  Neurological: Epicritic and protective threshold grossly intact bilaterally.   Musculoskeletal Exam: Range of motion within normal limits to all pedal and ankle joints bilateral. Muscle strength 5/5 in all groups bilateral.   Assessment: #1 onychomycosis bilateral great toenails  #2 Porokeratosis right foot x 1  Plan of Care:  #1 Patient was evaluated. #2 Excisional debridement of keratotic lesion(s) using a chisel blade was performed without incident. Salinocaine applied and light dressing placed. #3 Mechanical debridement of great toenails bilaterally performed using a nail nipper. Filed with dremel without incident.  #4 explained to the patient that I would like to wait  3 months prior to refilling her oral Lamisil.  This will allow her hepatic function to normalize if it is been altered in any way.  She can call the office in 3 months for refill on the Lamisil and she understands she will have to come into the office to receive the hepatic function panel paperwork prior to the refill authorization  #5 Return to clinic as needed.    Felecia Shelling, DPM [facility]  Dr. Felecia Shelling, DPM    [address]                                        [place], [place] [address]                Office [phone]  Fax [phone]

## 2019-08-23 ENCOUNTER — Encounter: Payer: Self-pay | Admitting: Podiatry

## 2019-08-24 ENCOUNTER — Encounter: Payer: Self-pay | Admitting: Internal Medicine

## 2019-08-24 DIAGNOSIS — H9190 Unspecified hearing loss, unspecified ear: Secondary | ICD-10-CM

## 2019-08-28 ENCOUNTER — Encounter: Payer: Self-pay | Admitting: Podiatry

## 2019-08-30 ENCOUNTER — Ambulatory Visit: Payer: BC Managed Care – PPO | Attending: Internal Medicine | Admitting: Audiologist

## 2019-08-30 ENCOUNTER — Other Ambulatory Visit: Payer: Self-pay

## 2019-08-30 DIAGNOSIS — H9313 Tinnitus, bilateral: Secondary | ICD-10-CM | POA: Insufficient documentation

## 2019-08-30 DIAGNOSIS — H903 Sensorineural hearing loss, bilateral: Secondary | ICD-10-CM | POA: Insufficient documentation

## 2019-08-30 NOTE — Telephone Encounter (Signed)
Left message to call for an appt. 9:26am-I called again and informed pt's husband I was calling to offer her an appt this morning. Mr. Tomlin states pt has left for an appt and he would let her know of my call.

## 2019-08-30 NOTE — Procedures (Signed)
Outpatient Audiology and [facility] [address] [place], [place]  [address] [phone]  AUDIOLOGICAL  EVALUATION  NAME: Danielle Gamble     DOB:   [birth year]      MRN: [id number]                                                                                     DATE: 08/30/2019     REFERENT: Pincus Sanes, MD STATUS: Outpatient DIAGNOSIS: Sensorineural Hearing Loss of Both Ears, Tinnitus   History: Danielle Gamble was seen for an audiological evaluation.  Danielle Gamble is receiving a hearing evaluation due to concerns for an occasional ringing in both ears. She hears a sound like the end a trumpet note. She has been noticing the sound more since taking care of her mother, she is in quieter environments now. The ringing is intermittent, occurs in one ear at a time, and does not last for more than a few minutes at a time. This difficulty began gradually. No pain or pressure reported in either ear. No tension in her jaw. Jaspreet has recently been under more stress as a caregiver for her mother. She is returning to work in August. No other relevant case history reported.   Evaluation:   Otoscopy showed a clear view of the tympanic membranes, bilaterally  Tympanometry results were consistent with normal function of the middle ear, bilaterally    Audiometric testing was completed using conventional audiometry with insert transducer. Speech Recognition Thresholds were consistent with pure tone averages. Word Recognition was poor at conversation levels and excellent at an elevated level. Pure tone thresholds show normal sloping to a moderately severe sensorineural hearing loss in both ears. Test results are consistent with a moderate high frequency bilateral hearing loss.   Results:  The test results were reviewed with Danielle Gamble. Her hearing loss is most likely the cause of the tinnitus. She was advised to use steady state masking sounds when in quiet places. The nature and degree of the  hearing loss was discussed. A high frequency hearing loss allows her to hear the vowels of the word, but she misses the high frequency consonants such as /s/, /f/, and /sh/. These sounds are all visible on the face so if she can see her communication partner's mouth she can fill in what she misses. However if someone speaks to her while walking away, from another room, over the phone, or in noise she will miss these sounds.  Her word recognition scores improved significantly with more volume. This shows she is a good candidate for amplification once she is ready and willing to consistently use the hearing aids. She was advised of the link between hearing loss and cognitive decline and how hearing aids can help reduce this risk. She reported understanding. A list of local audiologists who dispense hearing aids was given, and she was given several copies of her audiogram.   Recommendations: 1. Amplification is necessary for both ears. Hearing aids can be purchased from a variety of locations. See provided list for locations in the [place] area.     Ammie Ferrier  Audiologist, Au.D., CCC-A 08/30/2019  11:07 AM  Cc: Pincus Sanes,  MD

## 2019-08-31 ENCOUNTER — Other Ambulatory Visit: Payer: Self-pay | Admitting: Neurology

## 2019-09-09 LAB — HM COLONOSCOPY

## 2019-10-02 ENCOUNTER — Other Ambulatory Visit: Payer: Self-pay | Admitting: Internal Medicine

## 2019-10-02 DIAGNOSIS — Z136 Encounter for screening for cardiovascular disorders: Secondary | ICD-10-CM

## 2019-10-28 ENCOUNTER — Inpatient Hospital Stay: Admission: RE | Admit: 2019-10-28 | Payer: BC Managed Care – PPO | Source: Ambulatory Visit

## 2019-10-28 ENCOUNTER — Ambulatory Visit (INDEPENDENT_AMBULATORY_CARE_PROVIDER_SITE_OTHER)
Admission: RE | Admit: 2019-10-28 | Discharge: 2019-10-28 | Disposition: A | Discharge status: Home / Self Care | Payer: Self-pay | Source: Ambulatory Visit | Attending: Internal Medicine | Admitting: Internal Medicine

## 2019-10-28 ENCOUNTER — Other Ambulatory Visit: Payer: Self-pay

## 2019-10-28 DIAGNOSIS — Z136 Encounter for screening for cardiovascular disorders: Secondary | ICD-10-CM

## 2019-12-02 ENCOUNTER — Telehealth: Payer: Self-pay | Admitting: Podiatry

## 2019-12-02 NOTE — Telephone Encounter (Signed)
Pt called stating her 3 mth break from LAMISIL just ended and needs an order for a liver function test. Please advise.

## 2019-12-04 ENCOUNTER — Encounter: Payer: Self-pay | Admitting: *Deleted

## 2019-12-04 ENCOUNTER — Telehealth: Payer: Self-pay | Admitting: *Deleted

## 2019-12-04 NOTE — Telephone Encounter (Signed)
The patient is due for her yearly follow up for seizures. She was scheduled incorrectly on 12/09/19 at 4:15pm w/ Sarah. She has been rescheduled to 03/12/20 at 2:45pm with an arrival time of 2:15pm (no later afternoon appts available prior to this date). She receives her lamotrigine 100mg , one tablet BID from our office and we will need to continue her refills to her next appt date to avoid disruption to her therapy.  I left her a message with the appt information on her home and mobile voicemail. I have also sent her a .

## 2019-12-09 ENCOUNTER — Other Ambulatory Visit: Payer: Self-pay

## 2019-12-09 ENCOUNTER — Ambulatory Visit: Payer: Self-pay | Admitting: Neurology

## 2019-12-09 ENCOUNTER — Encounter: Payer: Self-pay | Admitting: Internal Medicine

## 2019-12-09 MED ORDER — ROSUVASTATIN CALCIUM 10 MG PO TABS: 10.0000 mg | ORAL_TABLET | Freq: Every day | ORAL | 3 refills | Status: DC

## 2020-01-14 ENCOUNTER — Encounter: Payer: BC Managed Care – PPO | Admitting: Obstetrics and Gynecology

## 2020-02-11 ENCOUNTER — Other Ambulatory Visit: Payer: Self-pay | Admitting: Neurology

## 2020-02-12 ENCOUNTER — Other Ambulatory Visit: Payer: Self-pay

## 2020-02-12 ENCOUNTER — Encounter: Payer: Self-pay | Admitting: Podiatry

## 2020-02-12 DIAGNOSIS — Z79899 Other long term (current) drug therapy: Secondary | ICD-10-CM

## 2020-02-20 ENCOUNTER — Encounter: Payer: Self-pay | Admitting: Internal Medicine

## 2020-02-20 MED ORDER — ROSUVASTATIN CALCIUM 10 MG PO TABS: 10.0000 mg | ORAL_TABLET | Freq: Every day | ORAL | 3 refills | Status: DC

## 2020-02-21 ENCOUNTER — Encounter: Payer: BC Managed Care – PPO | Admitting: Obstetrics and Gynecology

## 2020-02-24 ENCOUNTER — Telehealth (INDEPENDENT_AMBULATORY_CARE_PROVIDER_SITE_OTHER): Payer: BC Managed Care – PPO | Admitting: Neurology

## 2020-02-24 ENCOUNTER — Encounter: Payer: Self-pay | Admitting: Neurology

## 2020-02-24 ENCOUNTER — Encounter: Payer: Self-pay | Admitting: Internal Medicine

## 2020-02-24 DIAGNOSIS — G40309 Generalized idiopathic epilepsy and epileptic syndromes, not intractable, without status epilepticus: Secondary | ICD-10-CM | POA: Diagnosis not present

## 2020-02-24 MED ORDER — LAMOTRIGINE 100 MG PO TABS: 100.0000 mg | ORAL_TABLET | Freq: Two times a day (BID) | ORAL | 4 refills | Status: DC

## 2020-02-24 NOTE — Progress Notes (Signed)
    Virtual Visit via Video Note  I connected with Danielle Gamble on 02/24/20 at  4:00 PM EST by a video enabled telemedicine application and verified that I am speaking with the correct person using two identifiers.  Location: Patient: at her home Provider: in the office    I discussed the limitations of evaluation and management by telemedicine and the availability of in person appointments. The patient expressed understanding and agreed to proceed.  History of Present Illness: Danielle Gamble a 60 years old right-handed Caucasian female, previously patient of Dr. Sandria Manly,  I saw her initially on March 17th 2015.  She had a history of seizure, first one was in 1995, no warning signs, generalized tonic-clonic seizure, with tongue biting, incontinence, a second seizure was in 1997, generalized, mild body shaking,  MRI of the brain in 1997 was normal, EEG was normal,  She was initially treated with Dilantin, has been on current dose of lamotrigine since 2011, 100 mg tablet, half tablet in the morning, 1 tablet every night, no side effect, doing very well, still driving, work full time as a Hospital doctor,   UPDATE Jan 18 2017: She had a seizure in August 2018, lamotrigine dose was increased to 100 mg twice a day, laboratory evaluations in August 2018, lamotrigine level 7.5, normal CBC, CMP showed mild elevated calcium 10.4.  She still works as Runner, broadcasting/film/video at school, she only has very vague memory of the event on August 11th 2018, woke up in the kitchen inside, tongue biting, confused, nauseous,   Jun 06, 2018 Dr. 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 Remains on Lamictal 100 mg twice a day, tolerating  well. 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 Overall health has been good, she is on a statin. Would like to restart Lamisil for toenail fungus. Has labs done with PCP. Has a good relationship with PCP. No new problems or concerns. High speciality co-pay.  Observations/Objective: Via VV, A & O, speech clear and concise Excellent historian, well-appearing Moves all extremities without difficulty Gait appears steady and intact  Assessment and Plan: 1.  Epilepsy -Last seizure August 2018 -Continues to do well on Lamictal 100 mg twice a day -Refill sent in for Lamictal, since doing well, can have PCP continue to fill, follow-up for recurrent seizure, or new problem  Follow Up Instructions: PRN   I discussed the assessment and treatment plan with the patient. The patient was provided an opportunity to ask questions and all were answered. The patient agreed with the plan and demonstrated an understanding of the instructions.   The patient was advised to call back or seek an in-person evaluation if the symptoms worsen or if the condition fails to improve as anticipated.  I spent 20 minutes of face-to-face and non-face-to-face time with patient.  This included previsit chart review, lab review, study review, order entry, electronic health record documentation, patient education.  Otila Kluver, DNP  [facility] [address], [address] [place], [place] [address] [phone]

## 2020-02-28 ENCOUNTER — Encounter: Payer: Self-pay | Admitting: Podiatry

## 2020-02-28 ENCOUNTER — Ambulatory Visit: Payer: BC Managed Care – PPO | Admitting: Obstetrics and Gynecology

## 2020-02-28 ENCOUNTER — Encounter: Payer: Self-pay | Admitting: Obstetrics and Gynecology

## 2020-02-28 LAB — HEPATIC FUNCTION PANEL
AG Ratio: 1.9 (calc) (ref 1.0–2.5)
ALT: 36 U/L — ABNORMAL HIGH (ref 6–29)
AST: 23 U/L (ref 10–35)
Albumin: 4.6 g/dL (ref 3.6–5.1)
Alkaline phosphatase (APISO): 90 U/L (ref 37–153)
Bilirubin, Direct: 0.1 mg/dL (ref 0.0–0.2)
Globulin: 2.4 g/dL (calc) (ref 1.9–3.7)
Indirect Bilirubin: 0.2 mg/dL (calc) (ref 0.2–1.2)
Total Bilirubin: 0.3 mg/dL (ref 0.2–1.2)
Total Protein: 7 g/dL (ref 6.1–8.1)

## 2020-03-03 ENCOUNTER — Other Ambulatory Visit: Payer: Self-pay | Admitting: Podiatry

## 2020-03-03 MED ORDER — TERBINAFINE HCL 250 MG PO TABS: 250.0000 mg | ORAL_TABLET | Freq: Every day | ORAL | 0 refills | Status: DC

## 2020-03-05 NOTE — Progress Notes (Signed)
I have reviewed and agreed above plan. 

## 2020-03-12 ENCOUNTER — Ambulatory Visit: Payer: Self-pay | Admitting: Neurology

## 2020-03-17 ENCOUNTER — Ambulatory Visit: Payer: BC Managed Care – PPO | Admitting: Obstetrics and Gynecology

## 2020-03-17 ENCOUNTER — Encounter: Payer: Self-pay | Admitting: Obstetrics and Gynecology

## 2020-03-17 ENCOUNTER — Other Ambulatory Visit: Payer: Self-pay

## 2020-03-17 VITALS — BP 130/84 | HR 88 | Ht 67.75 in | Wt 165.0 lb

## 2020-03-17 DIAGNOSIS — M8580 Other specified disorders of bone density and structure, unspecified site: Secondary | ICD-10-CM | POA: Diagnosis not present

## 2020-03-17 DIAGNOSIS — Z01419 Encounter for gynecological examination (general) (routine) without abnormal findings: Secondary | ICD-10-CM

## 2020-03-17 NOTE — Progress Notes (Signed)
   Danielle Gamble 1960/07/01 [id number]  SUBJECTIVE:  60 y.o. [name] female here for annual routine gynecologic exam and Pap smear. She has no gynecologic concerns.   Current Outpatient Medications  Medication Sig Dispense Refill  . aspirin 81 MG tablet Take 81 mg by mouth daily.    Marland Kitchen lamoTRIgine (LAMICTAL) 100 MG tablet Take 1 tablet (100 mg total) by mouth 2 (two) times daily. 180 tablet 4  . rosuvastatin (CRESTOR) 10 MG tablet Take 1 tablet (10 mg total) by mouth daily. 90 tablet 3  . terbinafine (LAMISIL) 250 MG tablet Take 1 tablet (250 mg total) by mouth daily. 90 tablet 0   No current facility-administered medications for this visit.   Allergies: Patient has no known allergies.  No LMP recorded. Patient is postmenopausal.  Past medical history,surgical history, problem list, medications, allergies, family history and social history were all reviewed and documented as reviewed in the EPIC chart.  ROS: Pertinent positives and negatives as reviewed in HPI   OBJECTIVE:  BP 130/84 (BP Location: Right Arm, Patient Position: Sitting, Cuff Size: Normal)   Pulse 88   Ht 5' 7.75" (1.721 m)   Wt 165 lb (74.8 kg)   BMI 25.27 kg/m  The patient appears well, alert, oriented, in no distress.  Lungs are clear, good air entry, no wheezes, rhonchi or rales. S1 and S2 normal, no murmurs, regular rate and rhythm.  Abdomen soft without tenderness, guarding, mass or organomegaly.  Neurological is normal, no focal findings.  BREAST EXAM: breasts appear normal, no suspicious masses, no skin or nipple changes or axillary nodes  PELVIC EXAM: VULVA: normal appearing vulva with atrophic change, no masses, tenderness or lesions, VAGINA: normal appearing vagina with trophic change, normal color and discharge, no lesions, CERVIX: normal appearing cervix without discharge or lesions, UTERUS: uterus is normal size, shape, consistency and nontender, ADNEXA: normal adnexa in size, nontender and no  masses  Chaperone: Zenovia Jordan present during the examination  ASSESSMENT:  60 y.o. [name] here for annual gynecologic exam  PLAN:   1. Postmenopausal.  No significant menopausal symptoms.  No vaginal bleeding. 2. Pap smear/HPV 2019.  No significant history of abnormal Pap smears.  Next Pap smear due 2024 following the current guidelines recommending the 5 year interval. 3. Mammogram 2021 per patient at [facility] (record not in EMR).  Normal breast exam today.  She is reminded to schedule an annual mammogram this year when due. 4. Colonoscopy 2020.  She will follow up at the recommended interval per her GI specialist. 5. Osteopenia.  DEXA 10/2018.  Height measurement decline noted.  Encouraged adequate dietary calcium and will be starting vitamin D supplement.  Next DEXA recommended this coming fall 2022.  6. Health maintenance.  No labs today as she normally has these completed elsewhere.  The patient is aware that I will only be at this practice until early March 2022 so she knows to make sure she requests any needed follow-up when I am no longer at the practice.  Return annually or sooner, prn.  Theresia Majors MD 03/17/20

## 2020-06-01 ENCOUNTER — Other Ambulatory Visit: Payer: Self-pay | Admitting: Podiatry

## 2020-06-30 ENCOUNTER — Encounter: Payer: Self-pay | Admitting: Internal Medicine

## 2020-07-01 MED ORDER — HYDROCOD POLST-CPM POLST ER 10-8 MG/5ML PO SUER: 5.0000 mL | Freq: Two times a day (BID) | ORAL | 0 refills | Status: DC | PRN

## 2020-08-27 ENCOUNTER — Other Ambulatory Visit: Payer: Self-pay | Admitting: Podiatry

## 2020-08-31 ENCOUNTER — Telehealth: Payer: Self-pay | Admitting: Podiatry

## 2020-08-31 NOTE — Telephone Encounter (Signed)
Pt called requesting refill on terbinafine 30 day supply. She states is still dealing with her mother's illness and will schedule an appt to come see you as soon as she can.

## 2020-09-01 NOTE — Telephone Encounter (Signed)
Liver function test was slightly elevated.  No refill on terbinafine 30-day supply.  Thanks, Dr. Logan Bores

## 2020-09-25 ENCOUNTER — Telehealth: Payer: Self-pay | Admitting: *Deleted

## 2020-09-25 ENCOUNTER — Encounter: Payer: Self-pay | Admitting: Internal Medicine

## 2020-09-25 DIAGNOSIS — H9193 Unspecified hearing loss, bilateral: Secondary | ICD-10-CM

## 2020-09-25 NOTE — Telephone Encounter (Signed)
Patient is calling to request refill of Terbinafine-250 mg. Please advise.

## 2020-09-28 NOTE — Telephone Encounter (Signed)
Returned the call to patient, gave information per Dr Logan Bores concerning no refill on the Terbinafine , concerns with liver elevation. Please schedule for a f/u appointment

## 2020-09-29 NOTE — Telephone Encounter (Signed)
Okay to just come in for the paperwork. If everything is normal we can represcribe lamisil.

## 2020-09-29 NOTE — Telephone Encounter (Signed)
Returned call to patient and gave information per Dr Myer Haff understanding, will ask for Renea Ee to pick up lab orders.

## 2020-09-29 NOTE — Telephone Encounter (Signed)
Patient wanted to pick up paperwork like last time and go to the lab and have the blood draw. She wanted to know if she needed to see Dr. Logan Bores for an appointment.   Pleasew advise

## 2020-10-02 ENCOUNTER — Other Ambulatory Visit: Payer: Self-pay | Admitting: *Deleted

## 2020-10-02 ENCOUNTER — Telehealth: Payer: Self-pay | Admitting: *Deleted

## 2020-10-02 DIAGNOSIS — B351 Tinea unguium: Secondary | ICD-10-CM

## 2020-10-02 NOTE — Telephone Encounter (Signed)
Patient came in today to pick up lab orders for liver function panel, do not see in epic, added the order and gave paperwork to patient to take to [facility].

## 2020-10-03 ENCOUNTER — Other Ambulatory Visit: Payer: Self-pay | Admitting: Podiatry

## 2020-10-03 LAB — HEPATIC FUNCTION PANEL
ALT: 28 IU/L (ref 0–32)
AST: 27 IU/L (ref 0–40)
Albumin: 4.8 g/dL (ref 3.8–4.8)
Alkaline Phosphatase: 104 IU/L (ref 44–121)
Bilirubin Total: 0.3 mg/dL (ref 0.0–1.2)
Bilirubin, Direct: 0.12 mg/dL (ref 0.00–0.40)
Total Protein: 7.5 g/dL (ref 6.0–8.5)

## 2020-10-05 NOTE — Telephone Encounter (Signed)
Please advise 

## 2020-10-06 MED ORDER — TERBINAFINE HCL 250 MG PO TABS: 250.0000 mg | ORAL_TABLET | Freq: Every day | ORAL | 0 refills | Status: DC

## 2020-10-26 ENCOUNTER — Other Ambulatory Visit: Payer: Self-pay

## 2020-10-26 ENCOUNTER — Ambulatory Visit: Payer: BC Managed Care – PPO | Attending: Internal Medicine | Admitting: Audiologist

## 2020-10-26 DIAGNOSIS — H9313 Tinnitus, bilateral: Secondary | ICD-10-CM | POA: Insufficient documentation

## 2020-10-26 DIAGNOSIS — H903 Sensorineural hearing loss, bilateral: Secondary | ICD-10-CM | POA: Insufficient documentation

## 2020-10-26 NOTE — Procedures (Signed)
  Outpatient Audiology and [facility] [address] [place], [place]  [address] [phone]  AUDIOLOGICAL  EVALUATION  NAME: Danielle Gamble     DOB:   Feb 24, 1960      MRN: [id number]                                                                                     DATE: 10/26/2020     REFERENT: Pincus Sanes, MD STATUS: Outpatient DIAGNOSIS: Sensorineural Hearing Loss Bilateral   History: [name] was seen for an audiological evaluation.  [name] is receiving a hearing evaluation due to concerns for increased difficulty hearing, even one on one. [name] has difficulty hearing in background noise, crowds, and even when sitting across from people in quiet. This has gotten worse compared to last year. No pain or pressure reported in either ear.  Tinnitus present in both ears intermittently. No change in medical history since her last hearing test in July of 2021. No other relevant case history reported.   Evaluation:  Otoscopy showed a clear view of the tympanic membranes, bilaterally Tympanometry results were consistent with normal middle ear pressure, bilaterally   Audiometric testing was completed using conventional audiometry with insert transducer. Speech Recognition Thresholds were consistent with pure tone averages. Word Recognition was excellent at an elevated level. Pure tone thresholds show moderate sloping to severe sensorineural hearing loss in both ears. Test results are consistent with previous testing.   Results:  The test results were reviewed with [facility]. We dicussed at length the nature and degree of her hearing loss. She needs hearing aids in both ears. She was told of local options. She wants to try using her [employer] benefits. She was given the number to call [employer] which is the third party that works with [facility] for hearing aid benefits to check and see if she has this. She reported understanding all that was discussed.    Recommendations: Amplification  is necessary for both ears. Hearing aids can be purchased from a variety of locations. See provided list for locations in the [place] area.    Ammie Ferrier  Audiologist, Au.D., CCC-A 10/26/2020  8:54 AM  Cc: Pincus Sanes, MD

## 2020-11-02 ENCOUNTER — Encounter: Payer: Self-pay | Admitting: Internal Medicine

## 2020-11-02 DIAGNOSIS — M85851 Other specified disorders of bone density and structure, right thigh: Secondary | ICD-10-CM

## 2020-11-25 LAB — HM MAMMOGRAPHY

## 2020-12-03 ENCOUNTER — Encounter: Payer: Self-pay | Admitting: Internal Medicine

## 2020-12-03 NOTE — Progress Notes (Signed)
Outside notes received. Information abstracted. Notes sent to scan.  

## 2021-01-04 ENCOUNTER — Other Ambulatory Visit: Payer: Self-pay | Admitting: Podiatry

## 2021-01-14 ENCOUNTER — Other Ambulatory Visit: Payer: Self-pay | Admitting: Podiatry

## 2021-01-14 NOTE — Telephone Encounter (Signed)
Please advise, patient has not been seen since 08/19/19, please schedule for a medication f/u appointment.

## 2021-01-15 NOTE — Telephone Encounter (Signed)
Yes.  Patient will need a follow-up appointment prior to refill.  Thanks, Dr. Logan Bores

## 2021-01-20 NOTE — Telephone Encounter (Signed)
Please schedule f/u appointment

## 2021-02-08 ENCOUNTER — Ambulatory Visit: Payer: BC Managed Care – PPO | Admitting: Podiatry

## 2021-02-08 ENCOUNTER — Other Ambulatory Visit: Payer: Self-pay | Admitting: Podiatry

## 2021-02-08 ENCOUNTER — Other Ambulatory Visit: Payer: Self-pay

## 2021-02-08 DIAGNOSIS — M79674 Pain in right toe(s): Secondary | ICD-10-CM

## 2021-02-08 DIAGNOSIS — Z79899 Other long term (current) drug therapy: Secondary | ICD-10-CM | POA: Diagnosis not present

## 2021-02-08 DIAGNOSIS — B351 Tinea unguium: Secondary | ICD-10-CM | POA: Diagnosis not present

## 2021-02-08 DIAGNOSIS — M79675 Pain in left toe(s): Secondary | ICD-10-CM

## 2021-02-09 ENCOUNTER — Encounter: Payer: Self-pay | Admitting: Podiatry

## 2021-02-09 LAB — HEPATIC FUNCTION PANEL
ALT: 23 IU/L (ref 0–32)
AST: 25 IU/L (ref 0–40)
Albumin: 4.9 g/dL — ABNORMAL HIGH (ref 3.8–4.8)
Alkaline Phosphatase: 101 IU/L (ref 44–121)
Bilirubin Total: 0.3 mg/dL (ref 0.0–1.2)
Bilirubin, Direct: 0.11 mg/dL (ref 0.00–0.40)
Total Protein: 7.6 g/dL (ref 6.0–8.5)

## 2021-02-09 MED ORDER — TERBINAFINE HCL 250 MG PO TABS: 250.0000 mg | ORAL_TABLET | Freq: Every day | ORAL | 0 refills | Status: DC

## 2021-02-14 NOTE — Progress Notes (Signed)
° °  Subjective: 61 year old female presenting today for follow-up evaluation of onychomycosis and painful callus to the right foot this been present for several years.  Last visit the callus was debrided and she did feel some relief with that.  She has taken Lamisil in the past with some improvement of the toenails.  She presents for follow-up treatment and evaluation  Past Medical History:  Diagnosis Date   Allergic rhinitis    seasonal   Hyperlipidemia    Multinodular goiter (nontoxic)    Osteopenia 10/2018   T score -1.9 FRAX 8.7% / 0.9%   Ovarian cyst    PMH of   Seizure disorder (HCC)    Dr Sandria Manly    Objective: Physical Exam General: The patient is alert and oriented x3 in no acute distress.  Dermatology: Hyperkeratotic, discolored, thickened, onychodystrophy of bilateral great toenails noted with some improvement along the base of the nail.  It does appear that some of the fungal elements of the onychomycosis have improved significantly since last visit. Hyperkeratotic lesion present on the right foot x 1. Pain on palpation with a central nucleated core noted. Skin is warm, dry and supple bilateral lower extremities. Negative for open lesions or macerations.  Vascular: Palpable pedal pulses bilaterally. No edema or erythema noted. Capillary refill within normal limits.  Neurological: Epicritic and protective threshold grossly intact bilaterally.   Musculoskeletal Exam: Range of motion within normal limits to all pedal and ankle joints bilateral. Muscle strength 5/5 in all groups bilateral.   Assessment: #1 onychomycosis bilateral great toenails  #2 Porokeratosis right foot x 1  Plan of Care:  #1 Patient was evaluated. #2 Excisional debridement of keratotic lesion(s) using a chisel blade was performed without incident. Salinocaine applied and light dressing placed. #3 Mechanical debridement of great toenails bilaterally performed using a nail nipper. Filed with dremel without  incident.  #4 patient is requesting another refill of her Lamisil.  Prior to refilling her Lamisil I do recommend updated hepatic function panel since the patient has been on Lamisil this past year.  Orders placed for hepatic function panel #5 return to clinic as needed   Felecia Shelling, DPM [facility]  Dr. Felecia Shelling, DPM    [address]                                        [place], [place] [address]                Office [phone]  Fax [phone]

## 2021-03-18 ENCOUNTER — Ambulatory Visit: Payer: BC Managed Care – PPO | Admitting: Nurse Practitioner

## 2021-03-24 ENCOUNTER — Encounter: Payer: Self-pay | Admitting: Internal Medicine

## 2021-03-24 ENCOUNTER — Telehealth: Payer: Self-pay | Admitting: Internal Medicine

## 2021-03-24 ENCOUNTER — Other Ambulatory Visit: Payer: Self-pay | Admitting: Neurology

## 2021-03-24 ENCOUNTER — Other Ambulatory Visit: Payer: Self-pay

## 2021-03-24 MED ORDER — LAMOTRIGINE 100 MG PO TABS: 100.0000 mg | ORAL_TABLET | Freq: Two times a day (BID) | ORAL | 0 refills | Status: DC

## 2021-03-24 NOTE — Telephone Encounter (Signed)
Responded and message sent back.

## 2021-03-24 NOTE — Telephone Encounter (Signed)
Please see 03-24-2021 myhart message

## 2021-03-31 ENCOUNTER — Ambulatory Visit: Payer: BC Managed Care – PPO | Admitting: Nurse Practitioner

## 2021-04-01 ENCOUNTER — Encounter: Payer: Self-pay | Admitting: Internal Medicine

## 2021-04-02 ENCOUNTER — Encounter: Payer: BC Managed Care – PPO | Admitting: Internal Medicine

## 2021-04-08 ENCOUNTER — Encounter: Payer: BC Managed Care – PPO | Admitting: Internal Medicine

## 2021-04-16 ENCOUNTER — Other Ambulatory Visit: Payer: Self-pay | Admitting: Internal Medicine

## 2021-04-28 ENCOUNTER — Encounter: Payer: Self-pay | Admitting: Internal Medicine

## 2021-04-28 NOTE — Patient Instructions (Addendum)
? ? ? ?Blood work was ordered.   ? ? ?Medications changes include :   None ? ? ?Your prescription(s) have been sent to your pharmacy.  ? ? ? ?Return in about 1 year (around 04/30/2022) for CPE. ? ? ?Health Maintenance, Female ?Adopting a healthy lifestyle and getting preventive care are important in promoting health and wellness. Ask your health care provider about: ?The right schedule for you to have regular tests and exams. ?Things you can do on your own to prevent diseases and keep yourself healthy. ?What should I know about diet, weight, and exercise? ?Eat a healthy diet ? ?Eat a diet that includes plenty of vegetables, fruits, low-fat dairy products, and lean protein. ?Do not eat a lot of foods that are high in solid fats, added sugars, or sodium. ?Maintain a healthy weight ?Body mass index (BMI) is used to identify weight problems. It estimates body fat based on height and weight. Your health care provider can help determine your BMI and help you achieve or maintain a healthy weight. ?Get regular exercise ?Get regular exercise. This is one of the most important things you can do for your health. Most adults should: ?Exercise for at least 150 minutes each week. The exercise should increase your heart rate and make you sweat (moderate-intensity exercise). ?Do strengthening exercises at least twice a week. This is in addition to the moderate-intensity exercise. ?Spend less time sitting. Even light physical activity can be beneficial. ?Watch cholesterol and blood lipids ?Have your blood tested for lipids and cholesterol at 63 years of age, then have this test every 5 years. ?Have your cholesterol levels checked more often if: ?Your lipid or cholesterol levels are high. ?You are older than 62 years of age. ?You are at high risk for heart disease. ?What should I know about cancer screening? ?Depending on your health history and family history, you may need to have cancer screening at various ages. This may include  screening for: ?Breast cancer. ?Cervical cancer. ?Colorectal cancer. ?Skin cancer. ?Lung cancer. ?What should I know about heart disease, diabetes, and high blood pressure? ?Blood pressure and heart disease ?High blood pressure causes heart disease and increases the risk of stroke. This is more likely to develop in people who have high blood pressure readings or are overweight. ?Have your blood pressure checked: ?Every 3-5 years if you are 57-9 years of age. ?Every year if you are 75 years old or older. ?Diabetes ?Have regular diabetes screenings. This checks your fasting blood sugar level. Have the screening done: ?Once every three years after age 27 if you are at a normal weight and have a low risk for diabetes. ?More often and at a younger age if you are overweight or have a high risk for diabetes. ?What should I know about preventing infection? ?Hepatitis B ?If you have a higher risk for hepatitis B, you should be screened for this virus. Talk with your health care provider to find out if you are at risk for hepatitis B infection. ?Hepatitis C ?Testing is recommended for: ?Everyone born from [birth year] through 1967. ?Anyone with known risk factors for hepatitis C. ?Sexually transmitted infections (STIs) ?Get screened for STIs, including gonorrhea and chlamydia, if: ?You are sexually active and are younger than 63 years of age. ?You are older than 63 years of age and your health care provider tells you that you are at risk for this type of infection. ?Your sexual activity has changed since you were last screened, and you are at  increased risk for chlamydia or gonorrhea. Ask your health care provider if you are at risk. ?Ask your health care provider about whether you are at high risk for HIV. Your health care provider may recommend a prescription medicine to help prevent HIV infection. If you choose to take medicine to prevent HIV, you should first get tested for HIV. You should then be tested every 3 months for as  long as you are taking the medicine. ?Pregnancy ?If you are about to stop having your period (premenopausal) and you may become pregnant, seek counseling before you get pregnant. ?Take 400 to 800 micrograms (mcg) of folic acid every day if you become pregnant. ?Ask for birth control (contraception) if you want to prevent pregnancy. ?Osteoporosis and menopause ?Osteoporosis is a disease in which the bones lose minerals and strength with aging. This can result in bone fractures. If you are 66 years old or older, or if you are at risk for osteoporosis and fractures, ask your health care provider if you should: ?Be screened for bone loss. ?Take a calcium or vitamin D supplement to lower your risk of fractures. ?Be given hormone replacement therapy (HRT) to treat symptoms of menopause. ?Follow these instructions at home: ?Alcohol use ?Do not drink alcohol if: ?Your health care provider tells you not to drink. ?You are pregnant, may be pregnant, or are planning to become pregnant. ?If you drink alcohol: ?Limit how much you have to: ?0-1 drink a day. ?Know how much alcohol is in your drink. In the [place] one drink equals one 12 oz bottle of beer (355 mL), one 5 oz glass of wine (148 mL), or one 1? oz glass of hard liquor (44 mL). ?Lifestyle ?Do not use any products that contain nicotine or tobacco. These products include cigarettes, chewing tobacco, and vaping devices, such as e-cigarettes. If you need help quitting, ask your health care provider. ?Do not use street drugs. ?Do not share needles. ?Ask your health care provider for help if you need support or information about quitting drugs. ?General instructions ?Schedule regular health, dental, and eye exams. ?Stay current with your vaccines. ?Tell your health care provider if: ?You often feel depressed. ?You have ever been abused or do not feel safe at home. ?Summary ?Adopting a healthy lifestyle and getting preventive care are important in promoting health and  wellness. ?Follow your health care provider's instructions about healthy diet, exercising, and getting tested or screened for diseases. ?Follow your health care provider's instructions on monitoring your cholesterol and blood pressure. ?This information is not intended to replace advice given to you by your health care provider. Make sure you discuss any questions you have with your health care provider. ?Document Revised: 06/08/2020 Document Reviewed: 06/08/2020 ?Elsevier Patient Education ? [place]. ? ?

## 2021-04-28 NOTE — Progress Notes (Signed)
? ? ?Subjective:  ? ? Patient ID: Danielle Gamble, female    DOB: 07-Oct-1960, 61 y.o.   MRN: [id number] ? ? ?This visit occurred during the SARS-CoV-2 public health emergency.  Safety protocols were in place, including screening questions prior to the visit, additional usage of staff PPE, and extensive cleaning of exam room while observing appropriate contact time as indicated for disinfecting solutions. ? ? ? ?HPI ?Danielle Gamble is here for  ?Chief Complaint  ?Patient presents with  ? Annual Exam  ? ? ?No major complaints.  Her mother died recently.  She is doing well with that overall. ? ? ? ?Medications and allergies reviewed with patient and updated if appropriate. ? ? ? ?Current Outpatient Medications on File Prior to Visit  ?Medication Sig Dispense Refill  ? aspirin 81 MG tablet Take 81 mg by mouth daily.    ? lamoTRIgine (LAMICTAL) 100 MG tablet TAKE 1 TABLET BY MOUTH TWICE A DAY 60 tablet 0  ? terbinafine (LAMISIL) 250 MG tablet Take 1 tablet (250 mg total) by mouth daily. 90 tablet 0  ? ?No current facility-administered medications on file prior to visit.  ? ? ?Review of Systems  ?Constitutional:  Negative for fever.  ?Eyes:  Negative for visual disturbance.  ?Respiratory:  Negative for cough, shortness of breath and wheezing.   ?Cardiovascular:  Negative for chest pain, palpitations and leg swelling.  ?Gastrointestinal:  Positive for constipation. Negative for abdominal pain, blood in stool, diarrhea and nausea.  ?Genitourinary:  Negative for dysuria.  ?Musculoskeletal:  Positive for arthralgias (right hip pain). Negative for back pain.  ?Skin:  Negative for rash.  ?Neurological:  Negative for light-headedness and headaches.  ?Psychiatric/Behavioral:  Negative for dysphoric mood. The patient is not nervous/anxious.   ? ?   ?Objective:  ? ?Vitals:  ? 04/29/21 0846  ?BP: 120/84  ?Pulse: 91  ?Temp: 98.6 ?F (37 ?C)  ?SpO2: 95%  ? ?Filed Weights  ? 04/29/21 0846  ?Weight: 173 lb (78.5 kg)  ? ?Body mass index is 26.5  kg/m?. ? ?BP Readings from Last 3 Encounters:  ?04/29/21 120/84  ?03/17/20 130/84  ?05/22/19 (!) 154/86  ? ? ?Wt Readings from Last 3 Encounters:  ?04/29/21 173 lb (78.5 kg)  ?03/17/20 165 lb (74.8 kg)  ?05/22/19 168 lb (76.2 kg)  ? ? ? ?  05/22/2019  ?  8:26 AM  ?Depression screen PHQ 2/9  ?Decreased Interest 0  ?Down, Depressed, Hopeless 0  ?PHQ - 2 Score 0  ? ? ? ?   ? View : No data to display.  ?  ?  ?  ? ? ? ? ?  ?Physical Exam ?Constitutional: She appears well-developed and well-nourished. No distress.  ?HENT:  ?Head: Normocephalic and atraumatic.  ?Right Ear: External ear normal. Normal ear canal and TM ?Left Ear: External ear normal.  Normal ear canal and TM ?Mouth/Throat: Oropharynx is clear and moist.  ?Eyes: Conjunctivae and EOM are normal.  ?Neck: Neck supple. No tracheal deviation present. No thyromegaly present.  ?No carotid bruit  ?Cardiovascular: Normal rate, regular rhythm and normal heart sounds.   ?No murmur heard.  No edema. ?Pulmonary/Chest: Effort normal and breath sounds normal. No respiratory distress. She has no wheezes. She has no rales.  ?Breast: deferred   ?Abdominal: Soft. She exhibits no distension. There is no tenderness.  ?Lymphadenopathy: She has no cervical adenopathy.  ?Skin: Skin is warm and dry. She is not diaphoretic.  ?Psychiatric: She has a normal mood and affect.  Her behavior is normal.  ? ? ? ?Lab Results  ?Component Value Date  ? WBC 5.8 05/22/2019  ? HGB 15.0 05/22/2019  ? HCT 44.9 05/22/2019  ? PLT 245.0 05/22/2019  ? GLUCOSE 95 05/22/2019  ? CHOL 306 (H) 05/22/2019  ? TRIG (H) 05/22/2019  ?  599.0 Triglyceride is over 400; calculations on Lipids are invalid.  ? HDL 53.90 05/22/2019  ? LDLDIRECT 190.0 05/22/2019  ? LDLCALC 103 (H) 05/23/2014  ? ALT 23 02/08/2021  ? AST 25 02/08/2021  ? NA 140 05/22/2019  ? K 4.5 05/22/2019  ? CL 103 05/22/2019  ? CREATININE 0.95 05/22/2019  ? BUN 17 05/22/2019  ? CO2 29 05/22/2019  ? TSH 2.55 05/22/2019  ? HGBA1C 5.5 05/22/2019  ? ? ? ? ?    ?Assessment & Plan:  ? ?Physical exam: ?Screening blood work  ordered ?Exercise  starting - swimming-plans on exercising more regularly ?Weight  working on weight loss ?Substance abuse  none ? ? ?Reviewed recommended immunizations. ? ? ?Health Maintenance  ?Topic Date Due  ? Hepatitis C Screening  Never done  ? COLONOSCOPY (Pts 45-32yrs Insurance coverage will need to be confirmed)  05/12/2020  ? COVID-19 Vaccine (3 - Mixed Product risk series) 05/14/2021 (Originally 12/25/2020)  ? TETANUS/TDAP  04/30/2022 (Originally 09/02/2019)  ? DEXA SCAN  10/21/2021  ? PAP SMEAR-Modifier  10/27/2022  ? MAMMOGRAM  11/26/2022  ? INFLUENZA VACCINE  Completed  ? HIV Screening  Completed  ? Zoster Vaccines- Shingrix  Completed  ? HPV VACCINES  Aged Out  ?  ? ? ? ? ? ? ?See Problem List for Assessment and Plan of chronic medical problems. ? ? ? ? ?

## 2021-04-29 ENCOUNTER — Ambulatory Visit (INDEPENDENT_AMBULATORY_CARE_PROVIDER_SITE_OTHER): Payer: BC Managed Care – PPO | Admitting: Internal Medicine

## 2021-04-29 VITALS — BP 120/84 | HR 91 | Temp 98.6°F | Ht 67.75 in | Wt 173.0 lb

## 2021-04-29 DIAGNOSIS — E7849 Other hyperlipidemia: Secondary | ICD-10-CM | POA: Diagnosis not present

## 2021-04-29 DIAGNOSIS — M85851 Other specified disorders of bone density and structure, right thigh: Secondary | ICD-10-CM | POA: Diagnosis not present

## 2021-04-29 DIAGNOSIS — Z0000 Encounter for general adult medical examination without abnormal findings: Secondary | ICD-10-CM

## 2021-04-29 DIAGNOSIS — G479 Sleep disorder, unspecified: Secondary | ICD-10-CM

## 2021-04-29 DIAGNOSIS — G40309 Generalized idiopathic epilepsy and epileptic syndromes, not intractable, without status epilepticus: Secondary | ICD-10-CM

## 2021-04-29 DIAGNOSIS — Z1159 Encounter for screening for other viral diseases: Secondary | ICD-10-CM

## 2021-04-29 LAB — CBC WITH DIFFERENTIAL/PLATELET
Basophils Absolute: 0 10*3/uL (ref 0.0–0.1)
Basophils Relative: 0.6 % (ref 0.0–3.0)
Eosinophils Absolute: 0.2 10*3/uL (ref 0.0–0.7)
Eosinophils Relative: 3.1 % (ref 0.0–5.0)
HCT: 45.9 % (ref 36.0–46.0)
Hemoglobin: 15.3 g/dL — ABNORMAL HIGH (ref 12.0–15.0)
Lymphocytes Relative: 33.6 % (ref 12.0–46.0)
Lymphs Abs: 1.8 10*3/uL (ref 0.7–4.0)
MCHC: 33.3 g/dL (ref 30.0–36.0)
MCV: 95.2 fl (ref 78.0–100.0)
Monocytes Absolute: 0.4 10*3/uL (ref 0.1–1.0)
Monocytes Relative: 6.9 % (ref 3.0–12.0)
Neutro Abs: 3.1 10*3/uL (ref 1.4–7.7)
Neutrophils Relative %: 55.8 % (ref 43.0–77.0)
Platelets: 213 10*3/uL (ref 150.0–400.0)
RBC: 4.82 Mil/uL (ref 3.87–5.11)
RDW: 13 % (ref 11.5–15.5)
WBC: 5.5 10*3/uL (ref 4.0–10.5)

## 2021-04-29 LAB — LIPID PANEL
Cholesterol: 182 mg/dL (ref 0–200)
HDL: 58.2 mg/dL (ref >39.00)
NonHDL: 123.76
Total CHOL/HDL Ratio: 3
Triglycerides: 360 mg/dL — ABNORMAL HIGH (ref 0.0–149.0)
VLDL: 72 mg/dL — ABNORMAL HIGH (ref 0.0–40.0)

## 2021-04-29 LAB — LDL CHOLESTEROL, DIRECT: Direct LDL: 91 mg/dL

## 2021-04-29 LAB — COMPREHENSIVE METABOLIC PANEL
ALT: 25 U/L (ref 0–35)
AST: 23 U/L (ref 0–37)
Albumin: 4.7 g/dL (ref 3.5–5.2)
Alkaline Phosphatase: 90 U/L (ref 39–117)
BUN: 19 mg/dL (ref 6–23)
CO2: 29 mEq/L (ref 19–32)
Calcium: 10.7 mg/dL — ABNORMAL HIGH (ref 8.4–10.5)
Chloride: 104 mEq/L (ref 96–112)
Creatinine, Ser: 1.01 mg/dL (ref 0.40–1.20)
GFR: 59.66 mL/min — ABNORMAL LOW (ref >60.00)
Glucose, Bld: 101 mg/dL — ABNORMAL HIGH (ref 70–99)
Potassium: 4.2 mEq/L (ref 3.5–5.1)
Sodium: 142 mEq/L (ref 135–145)
Total Bilirubin: 0.4 mg/dL (ref 0.2–1.2)
Total Protein: 7.8 g/dL (ref 6.0–8.3)

## 2021-04-29 LAB — TSH: TSH: 2.59 u[IU]/mL (ref 0.35–5.50)

## 2021-04-29 LAB — VITAMIN D 25 HYDROXY (VIT D DEFICIENCY, FRACTURES): VITD: 14.12 ng/mL — ABNORMAL LOW (ref 30.00–100.00)

## 2021-04-29 MED ORDER — ROSUVASTATIN CALCIUM 10 MG PO TABS: 10.0000 mg | ORAL_TABLET | Freq: Every day | ORAL | 3 refills | Status: DC

## 2021-04-29 NOTE — Assessment & Plan Note (Signed)
Chronic ?Has remained stable on medication without seizure activity ?No longer following with neurology ?I will prescribe Lamictal ?Continue Lamictal 100 mg twice daily ?

## 2021-04-29 NOTE — Assessment & Plan Note (Signed)
Chronic Regular exercise and healthy diet encouraged Check lipid panel  Continue rosuvastatin 10 mg daily 

## 2021-04-29 NOTE — Assessment & Plan Note (Signed)
She tries not to drink ?After 5 PM so that she does not have to wake up to the bathroom because when she wakes up she has difficulty getting back to sleep ? ?

## 2021-04-29 NOTE — Assessment & Plan Note (Signed)
Chronic ?Monitored by GYN ?Test regular exercise calcium vitamin D supplementation ?Check vitamin D level ?

## 2021-04-30 ENCOUNTER — Encounter: Payer: Self-pay | Admitting: Internal Medicine

## 2021-04-30 NOTE — Progress Notes (Signed)
Outside notes received. Information abstracted. Notes sent to scan.  

## 2021-05-02 LAB — HEPATITIS C ANTIBODY
Hepatitis C Ab: NONREACTIVE
SIGNAL TO CUT-OFF: 0.03 (ref <1.00)

## 2021-05-02 LAB — LAMOTRIGINE LEVEL: Lamotrigine Lvl: 4.7 ug/mL (ref 2.5–15.0)

## 2021-05-03 ENCOUNTER — Encounter: Payer: Self-pay | Admitting: Internal Medicine

## 2021-05-03 ENCOUNTER — Ambulatory Visit: Payer: BC Managed Care – PPO | Admitting: Nurse Practitioner

## 2021-05-03 ENCOUNTER — Ambulatory Visit (INDEPENDENT_AMBULATORY_CARE_PROVIDER_SITE_OTHER): Payer: BC Managed Care – PPO | Admitting: Nurse Practitioner

## 2021-05-03 ENCOUNTER — Encounter: Payer: Self-pay | Admitting: Nurse Practitioner

## 2021-05-03 VITALS — BP 134/80 | Ht 67.5 in | Wt 174.0 lb

## 2021-05-03 DIAGNOSIS — Z780 Asymptomatic menopausal state: Secondary | ICD-10-CM

## 2021-05-03 DIAGNOSIS — M8589 Other specified disorders of bone density and structure, multiple sites: Secondary | ICD-10-CM

## 2021-05-03 DIAGNOSIS — Z01419 Encounter for gynecological examination (general) (routine) without abnormal findings: Secondary | ICD-10-CM | POA: Diagnosis not present

## 2021-05-03 NOTE — Progress Notes (Signed)
? ?  LAGENA HABA [birth year] [id number] ? ? ?History:  61 y.o. G0 presents for annual exam. Postmenopausal - no HRT, no bleeding. Normal pap history. H/O right ovarian cystectomy in 75s. Seizure disorder managed by neurology, HLD managed by PCP.  ? ?Gynecologic History ?No LMP recorded. Patient is postmenopausal. ?  ?Contraception/Family planning: post menopausal status ?Sexually active: Yes ? ?Health Maintenance ?Last Pap: 10/26/2017. Results were: Normal, 5-year repeat ?Last mammogram: 11/25/2020. Results were: Normal ?Last colonoscopy: 09/09/2019. Results were: Polyp, 5-year recall ?Last Dexa: 10/22/2018. Results were: T-score-1.9, FRAX 8.7% / 0.9% ? ?Past medical history, past surgical history, family history and social history were all reviewed and documented in the EPIC chart. Married. Retired Pharmacist, hospital.  ? ?ROS:  A ROS was performed and pertinent positives and negatives are included. ? ?Exam: ? ?Vitals:  ? 05/03/21 1104  ?BP: 134/80  ?Weight: 174 lb (78.9 kg)  ?Height: 5' 7.5" (1.715 m)  ? ?Body mass index is 26.85 kg/m?. ? ?General appearance:  Normal ?Thyroid:  Symmetrical, normal in size, without palpable masses or nodularity. ?Respiratory ? Auscultation:  Clear without wheezing or rhonchi ?Cardiovascular ? Auscultation:  Regular rate, without rubs, murmurs or gallops ? Edema/varicosities:  Not grossly evident ?Abdominal ? Soft,nontender, without masses, guarding or rebound. ? Liver/spleen:  No organomegaly noted ? Hernia:  None appreciated ? Skin ? Inspection:  Grossly normal ?Breasts: Examined lying and sitting.  ? Right: Without masses, retractions, nipple discharge or axillary adenopathy. ? ? Left: Without masses, retractions, nipple discharge or axillary adenopathy. ?Genitourinary  ? Inguinal/mons:  Normal without inguinal adenopathy ? External genitalia:  Normal appearing vulva with no masses, tenderness, or lesions ? BUS/Urethra/Skene's glands:  Normal ? Vagina:  Normal appearing with normal color and  discharge, no lesions ? Cervix:  Normal appearing without discharge or lesions ? Uterus:  Normal in size, shape and contour.  Midline and mobile, nontender ? Adnexa/parametria:   ?  Rt: Normal in size, without masses or tenderness. ?  Lt: Normal in size, without masses or tenderness. ? Anus and perineum: Normal ? Digital rectal exam: Normal sphincter tone without palpated masses or tenderness ? ?Patient informed chaperone available to be present for breast and pelvic exam. Patient has requested no chaperone to be present. Patient has been advised what will be completed during breast and pelvic exam.  ? ?Assessment/Plan:  61 y.o. G0 for annual exam.  ? ?Well female exam with routine gynecological exam - Education provided on SBEs, importance of preventative screenings, current guidelines, high calcium diet, regular exercise, and multivitamin daily.  Labs with PCP.  ? ?Postmenopausal - no HRT, no bleeding ? ?Osteopenia of multiple sites - T-score -1.9 without elevated FRAX. Continue Vitamin D + Calcium supplement and increase exercise. Recommend repeating DXA now. Reports PCP is setting it up.  ? ?Screening for cervical cancer - Normal Pap history.  Will repeat at 5-year interval per guidelines. ? ?Screening for breast cancer - Normal mammogram history.  Continue annual screenings.  Normal breast exam today. ? ?Screening for colon cancer - 2021 colonoscopy. Will repeat at 5-year interval per GI's recommendation.  ? ?Return in 1 year for annual.  ? ? ? ?Tamela Gammon DNP, 11:32 AM 05/03/2021 ? ?

## 2021-05-04 ENCOUNTER — Encounter: Payer: Self-pay | Admitting: Internal Medicine

## 2021-05-04 MED ORDER — HYDROCOD POLI-CHLORPHE POLI ER 10-8 MG/5ML PO SUER: 5.0000 mL | Freq: Two times a day (BID) | ORAL | 0 refills | Status: DC | PRN

## 2021-05-07 ENCOUNTER — Other Ambulatory Visit: Payer: Self-pay | Admitting: Podiatry

## 2021-05-16 ENCOUNTER — Other Ambulatory Visit: Payer: Self-pay | Admitting: Internal Medicine

## 2021-06-23 ENCOUNTER — Encounter (HOSPITAL_COMMUNITY): Payer: Self-pay | Admitting: Emergency Medicine

## 2021-06-23 ENCOUNTER — Emergency Department (HOSPITAL_COMMUNITY): Payer: BC Managed Care – PPO

## 2021-06-23 ENCOUNTER — Emergency Department (HOSPITAL_COMMUNITY)
Admission: EM | Admit: 2021-06-23 | Discharge: 2021-06-24 | Disposition: A | Discharge status: Home / Self Care | Payer: BC Managed Care – PPO | Attending: Emergency Medicine | Admitting: Emergency Medicine

## 2021-06-23 DIAGNOSIS — R55 Syncope and collapse: Secondary | ICD-10-CM

## 2021-06-23 DIAGNOSIS — R531 Weakness: Secondary | ICD-10-CM | POA: Insufficient documentation

## 2021-06-23 DIAGNOSIS — R42 Dizziness and giddiness: Secondary | ICD-10-CM | POA: Insufficient documentation

## 2021-06-23 DIAGNOSIS — Z7982 Long term (current) use of aspirin: Secondary | ICD-10-CM | POA: Insufficient documentation

## 2021-06-23 DIAGNOSIS — R000 Tachycardia, unspecified: Secondary | ICD-10-CM | POA: Diagnosis not present

## 2021-06-23 LAB — CBC WITH DIFFERENTIAL/PLATELET
Abs Immature Granulocytes: 0.01 10*3/uL (ref 0.00–0.07)
Basophils Absolute: 0 10*3/uL (ref 0.0–0.1)
Basophils Relative: 1 %
Eosinophils Absolute: 0.2 10*3/uL (ref 0.0–0.5)
Eosinophils Relative: 3 %
HCT: 43 % (ref 36.0–46.0)
Hemoglobin: 13.9 g/dL (ref 12.0–15.0)
Immature Granulocytes: 0 %
Lymphocytes Relative: 36 %
Lymphs Abs: 2.4 10*3/uL (ref 0.7–4.0)
MCH: 31 pg (ref 26.0–34.0)
MCHC: 32.3 g/dL (ref 30.0–36.0)
MCV: 95.8 fL (ref 80.0–100.0)
Monocytes Absolute: 0.4 10*3/uL (ref 0.1–1.0)
Monocytes Relative: 7 %
Neutro Abs: 3.6 10*3/uL (ref 1.7–7.7)
Neutrophils Relative %: 53 %
Platelets: 196 10*3/uL (ref 150–400)
RBC: 4.49 MIL/uL (ref 3.87–5.11)
RDW: 12.5 % (ref 11.5–15.5)
WBC: 6.7 10*3/uL (ref 4.0–10.5)
nRBC: 0 % (ref 0.0–0.2)

## 2021-06-23 LAB — BASIC METABOLIC PANEL
Anion gap: 9 (ref 5–15)
BUN: 18 mg/dL (ref 8–23)
CO2: 22 mmol/L (ref 22–32)
Calcium: 9.6 mg/dL (ref 8.9–10.3)
Chloride: 111 mmol/L (ref 98–111)
Creatinine, Ser: 1.01 mg/dL — ABNORMAL HIGH (ref 0.44–1.00)
GFR, Estimated: >60 mL/min (ref >60)
Glucose, Bld: 93 mg/dL (ref 70–99)
Potassium: 3.5 mmol/L (ref 3.5–5.1)
Sodium: 142 mmol/L (ref 135–145)

## 2021-06-23 LAB — D-DIMER, QUANTITATIVE: D-Dimer, Quant: 1.29 ug/mL-FEU — ABNORMAL HIGH (ref 0.00–0.50)

## 2021-06-23 LAB — TROPONIN I (HIGH SENSITIVITY)
Troponin I (High Sensitivity): 4 ng/L (ref <18)
Troponin I (High Sensitivity): 9 ng/L (ref <18)

## 2021-06-23 LAB — CBG MONITORING, ED: Glucose-Capillary: 108 mg/dL — ABNORMAL HIGH (ref 70–99)

## 2021-06-23 MED ORDER — SODIUM CHLORIDE 0.9 % IV BOLUS
500.0000 mL | Freq: Once | INTRAVENOUS | Status: AC
  Administered 2021-06-23: 500 mL via INTRAVENOUS

## 2021-06-23 NOTE — ED Triage Notes (Signed)
Pt arrives via EMS from [facility] where pt was shopping and began to feel dizzy and like her BP was elevated, and shaky hands. Pt lowered herself to the floor to prevent falling. EMS reports pt HR was in 120s, improved after 400 of fluid. Denies CP or SOB.

## 2021-06-23 NOTE — ED Provider Notes (Signed)
[facility]   CSN: [id number] Arrival date & time: 06/23/21  1700     History  Chief Complaint  Patient presents with   Dizziness   Weakness    Danielle Gamble is a 61 y.o. female.  Patient presents via EMS after episode of sudden onset of dizziness and weakness. Patient was able to lower herself to the floor. She did not lose consciousness or hit her head. EMS noted patient with elevated heart rate heart rate in the 120's. Sinus tach vs a flutter with 2:1 AV block. Patient given fluid bolus by EMS with improvement in heart rate. EMS ECG reveals LBBB. Patient denies any personal cardiac history, but has had episodes of tachyarrythmias in the past. No associated chest pain or shortness of breath. Denies recent illness, nausea, vomiting, or diarrhea. No back or abdominal pain.  The history is provided by the patient. No language interpreter was used.  Dizziness Quality:  Lightheadedness Severity:  Moderate Onset quality:  Sudden Progression:  Resolved Chronicity:  New Context: not with loss of consciousness   Relieved by:  Fluids and change in position Associated symptoms: weakness   Associated symptoms: no chest pain, no diarrhea, no headaches, no nausea, no shortness of breath and no vomiting   Risk factors: no hx of stroke and no new medications   Weakness Severity:  Moderate Onset quality:  Sudden Progression:  Resolved Associated symptoms: dizziness   Associated symptoms: no abdominal pain, no chest pain, no cough, no diarrhea, no dysuria, no headaches, no nausea, no shortness of breath and no vomiting   Risk factors: no heart disease and no new medications       Home Medications Prior to Admission medications   Medication Sig Start Date End Date Taking? Authorizing Provider  aspirin 81 MG tablet Take 81 mg by mouth daily.    [provider]  chlorpheniramine-HYDROcodone (TUSSIONEX PENNKINETIC ER) 10-8  MG/5ML Take 5 mLs by mouth every 12 (twelve) hours as needed for cough. 05/04/21   Pincus SanesBurns, Stacy J, MD  lamoTRIgine (LAMICTAL) 100 MG tablet TAKE 1 TABLET BY MOUTH TWICE A DAY 05/17/21   Pincus SanesBurns, Stacy J, MD  rosuvastatin (CRESTOR) 10 MG tablet Take 1 tablet (10 mg total) by mouth daily. 04/29/21   Pincus SanesBurns, Stacy J, MD  terbinafine (LAMISIL) 250 MG tablet Take 1 tablet (250 mg total) by mouth daily. 02/09/21   Felecia ShellingEvans, Brent M, DPM  VITAMIN D PO Take by mouth.    [provider]      Allergies    Patient has no known allergies.    Review of Systems   Review of Systems  Respiratory:  Negative for cough and shortness of breath.   Cardiovascular:  Negative for chest pain.  Gastrointestinal:  Negative for abdominal pain, diarrhea, nausea and vomiting.  Genitourinary:  Negative for dysuria.  Neurological:  Positive for dizziness and weakness. Negative for headaches.  All other systems reviewed and are negative.  Physical Exam Updated Vital Signs BP (!) 139/103 (BP Location: Right Arm)   Pulse (!) 103   Temp 98.9 F (37.2 C) (Oral)   Resp 17   Ht 5\' 8"  (1.727 m)   Wt 77.1 kg   SpO2 98%   BMI 25.85 kg/m  Physical Exam Constitutional:      Appearance: Normal appearance.  HENT:     Head: Normocephalic.     Nose: Nose normal.     Mouth/Throat:     Mouth:  Mucous membranes are moist.  Eyes:     Conjunctiva/sclera: Conjunctivae normal.  Cardiovascular:     Rate and Rhythm: Tachycardia present.     Heart sounds: Normal heart sounds.  Pulmonary:     Effort: Pulmonary effort is normal.     Breath sounds: Normal breath sounds.  Abdominal:     General: There is no distension.     Palpations: Abdomen is soft.     Tenderness: There is no abdominal tenderness.  Musculoskeletal:        General: Normal range of motion.     Right lower leg: No edema.     Left lower leg: No edema.  Skin:    General: Skin is warm and dry.  Neurological:     Mental Status: She is alert and oriented to  person, place, and time.  Psychiatric:        Mood and Affect: Mood normal.        Behavior: Behavior normal.    ED Results / Procedures / Treatments   Labs (all labs ordered are listed, but only abnormal results are displayed) Labs Reviewed  BASIC METABOLIC PANEL - Abnormal; Notable for the following components:      Result Value   Creatinine, Ser 1.01 (*)    All other components within normal limits  D-DIMER, QUANTITATIVE - Abnormal; Notable for the following components:   D-Dimer, Quant 1.29 (*)    All other components within normal limits  CBG MONITORING, ED - Abnormal; Notable for the following components:   Glucose-Capillary 108 (*)    All other components within normal limits  CBC WITH DIFFERENTIAL/PLATELET  URINALYSIS, ROUTINE W REFLEX MICROSCOPIC  TROPONIN I (HIGH SENSITIVITY)  TROPONIN I (HIGH SENSITIVITY)    EKG EKG Interpretation  Date/Time:  Wednesday Jun 23 2021 17:03:33 EDT Ventricular Rate:  108 PR Interval:  165 QRS Duration: 136 QT Interval:  363 QTC Calculation: 487 R Axis:   57 Text Interpretation: Sinus tachycardia Left bundle branch block Negative sgarbossa's criteria Confirmed by Ernie Avena ([id number]) on 06/23/2021 7:37:55 PM  Radiology DG Chest Port 1 View  Result Date: 06/23/2021 CLINICAL DATA:  Dizziness EXAM: PORTABLE CHEST 1 VIEW COMPARISON:  None Available. FINDINGS: The heart size and mediastinal contours are within normal limits. Both lungs are clear. The visualized skeletal structures are unremarkable. IMPRESSION: No active disease. Electronically Signed   By: Ernie Avena M.D.   On: 06/23/2021 18:00    Procedures Procedures    Medications Ordered in ED Medications - No data to display  ED Course/ Medical Decision Making/ A&P  Negative delta troponin. Positive d-dimer. CTA of chest requested.                         Medical Decision Making Patient with episode of near syncope, with documented sinus tachycardia and possible  atrial flutter with 2:1 conduction. New left bundle branch block. 12 lead does not meet sgarbossa criteria for ischemia. Negative delta troponin. Elevated d-dimer, will evaluate for PE.  Amount and/or Complexity of Data Reviewed Labs: ordered.    Details: elevated d-dimer, will evaluate with CTA chest Radiology: ordered.    Details: No acute findings on CXR. ECG/medicine tests: ordered.    Details: sinus rhythm, LBBB   Patient signed out to S. Petrucelli, PA, awaiting completion of CTA of chest. If no acute findings, patient will be able to be discharged home with cardiology referral.        Final Clinical  Impression(s) / ED Diagnoses Final diagnoses:  None    Rx / DC Orders ED Discharge Orders     None         Felicie Morn, NP 06/24/21 Perlie Mayo    Ernie Avena, MD 06/26/21 1007

## 2021-06-24 ENCOUNTER — Emergency Department (HOSPITAL_COMMUNITY): Payer: BC Managed Care – PPO

## 2021-06-24 MED ORDER — IOHEXOL 350 MG/ML SOLN
50.0000 mL | Freq: Once | INTRAVENOUS | Status: AC | PRN
  Administered 2021-06-24: 50 mL via INTRAVENOUS

## 2021-06-24 NOTE — Discharge Instructions (Addendum)
You were seen in the ER today for nearly passing out.  Your labs were overall reassuring.  Your CT scan did not show a blood clot, however it did shoe findings of aortic atherosclerosis, otherwise known as plaque in one of your blood vessels, please discuss this with your primary care provider to ensure that your blood pressure and cholesterol are being well controlled. Your EKG showed findings of a left bundle branch block which is new compared to your prior EKG from 2013.  Given these findings we would like you to follow-up with cardiology, we have sent an ambulatory referral, they will call you to help schedule an appointment, we have also provided their office information.  Please rest, drink plenty of fluids, follow-up with cardiology and primary care as soon as possible.  Return to the emergency department for any new or worsening symptoms including but not limited to passing out, chest pain, trouble breathing, vomiting, feeling sweaty/clammy, or any other concerns.

## 2021-06-24 NOTE — ED Notes (Signed)
Pt to CT

## 2021-06-24 NOTE — ED Provider Notes (Signed)
Near syncope @ grocery store, started to feel lightheaded, sat down on the floor, no head injury, no full sycnope  Per EMS flutter 2;1 on their arrival?  Converted to sinus tachycardia with them. -- new LBBB   CTA   Cardiology referral   No chest pain  with th event    Physical Exam  BP 128/74   Pulse 73   Temp 98.9 F (37.2 C) (Oral)   Resp 16   Ht 5\' 8"  (1.727 m)   Wt 77.1 kg   SpO2 98%   BMI 25.85 kg/m   Physical Exam  Procedures  Procedures  ED Course / MDM    Medical Decision Making Amount and/or Complexity of Data Reviewed Labs: ordered. Radiology: ordered.   ***

## 2021-06-29 ENCOUNTER — Encounter: Payer: Self-pay | Admitting: Internal Medicine

## 2021-06-29 ENCOUNTER — Ambulatory Visit: Payer: BC Managed Care – PPO | Admitting: Internal Medicine

## 2021-06-29 VITALS — BP 126/72 | HR 99 | Ht 68.0 in | Wt 175.2 lb

## 2021-06-29 DIAGNOSIS — R000 Tachycardia, unspecified: Secondary | ICD-10-CM | POA: Diagnosis not present

## 2021-06-29 DIAGNOSIS — R9431 Abnormal electrocardiogram [ECG] [EKG]: Secondary | ICD-10-CM

## 2021-06-29 NOTE — Progress Notes (Addendum)
Cardiology Office Note:    Date:  06/29/2021   ID:  Danielle Gamble, DOB 20-Mar-1960, MRN [id number]  PCP:  Pincus Sanes, MD   [facility] Providers Cardiologist:  None     Referring MD: Cherly Anderson,*   No chief complaint on file. Near syncope  History of Present Illness:    Danielle Gamble is a 61 y.o. female with a hx of seizure disorder, recent visit to the ED after sudden onset dizziness and weakness. [facility] and [facility] came. ECG showed sinus tachycardia and LBBB.  Negative sgarbossa.  Troponin was negative. Prior CAC of 0 in 2021. Today, she said she felt well prior to this. She did not LOC during this episode. No seizure activity. Her blood glucose was 92. She has felt better. She has noted some heart fluttering. Father was 77 with coronary disease with bypass and stroke. Otherwise, she has no cardiac dx history.  Past Medical History:  Diagnosis Date   Allergic rhinitis    seasonal   Hyperlipidemia    Multinodular goiter (nontoxic)    Osteopenia 10/2018   T score -1.9 FRAX 8.7% / 0.9%   Ovarian cyst    PMH of   Seizure disorder [facility])    Dr Sandria Manly    Past Surgical History:  Procedure Laterality Date   ABDOMINAL SURGERY     Expl. Laparotomy-Right ovar.cyst   BIOPSY THYROID  2015   Dr Elvera Lennox   COLONOSCOPY  2012   negative, F/U in 2022([facility])   OVARIAN CYST SURGERY     done in [facility]   Sternal Cystectomy      Current Medications: No outpatient medications have been marked as taking for the 06/29/21 encounter (Appointment) with Maisie Fus, MD.     Allergies:   Patient has no known allergies.   Social History   Socioeconomic History   Marital status: Married    Spouse name: [name]   Number of children: 0   Years of education: college   Highest education level: Not on file  Occupational History    Employer: [employer]  Tobacco Use   Smoking status: Never   Smokeless tobacco: Never  Vaping Use    Vaping Use: Never used  Substance and Sexual Activity   Alcohol use: Yes    Alcohol/week: 1.0 standard drink    Types: 1 Glasses of wine per week    Comment: socially on weekends   Drug use: No   Sexual activity: Yes    Birth control/protection: Other-see comments    Comment: VASECTOMY-1st intercourse 61 yo-Fewer than 5 partners  Other Topics Concern   Not on file  Social History Narrative   Patient  Lives at home with her husband [name]) [name]    Patient works full time at [employer]   Right handed   Caffeine two cups daily   Social Determinants of Corporate investment banker Strain: Not on file  Food Insecurity: Not on file  Transportation Needs: Not on file  Physical Activity: Not on file  Stress: Not on file  Social Connections: Not on file     Family History: The patient's family history includes Alzheimer's disease in her mother; Coronary artery disease in her paternal uncle; Heart attack (age of onset: 6) in her father; Prostate cancer in her father; Stroke (age of onset: 28) in her father.  ROS:   Please see the history of present illness.  All other systems reviewed and are negative.  EKGs/Labs/Other Studies Reviewed:    The following studies were reviewed today:   EKG:  EKG is  ordered today.  The ekg ordered today demonstrates  NSR, LBBB  Recent Labs: 04/29/2021: ALT 25; TSH 2.59 06/23/2021: BUN 18; Creatinine, Ser 1.01; Hemoglobin 13.9; Platelets 196; Potassium 3.5; Sodium 142  Recent Lipid Panel    Component Value Date/Time   CHOL 182 04/29/2021 0944   CHOL 200 (H) 05/23/2014 0732   TRIG 360.0 (H) 04/29/2021 0944   TRIG 169 (H) 05/23/2014 0732   HDL 58.20 04/29/2021 0944   HDL 63 05/23/2014 0732   CHOLHDL 3 04/29/2021 0944   VLDL 72.0 (H) 04/29/2021 0944   LDLCALC 103 (H) 05/23/2014 0732   LDLDIRECT 91.0 04/29/2021 0944     Risk Assessment/Calculations:           Physical Exam:    VS:   Vitals:   06/29/21 0827   BP: 126/72  Pulse: 99  SpO2: 98%     Wt Readings from Last 3 Encounters:  06/23/21 170 lb (77.1 kg)  05/03/21 174 lb (78.9 kg)  04/29/21 173 lb (78.5 kg)     GEN:  Well nourished, well developed in no acute distress HEENT: Normal NECK: No JVD; No carotid bruits LYMPHATICS: No lymphadenopathy CARDIAC: RRR, no murmurs, rubs, gallops RESPIRATORY:  Clear to auscultation without rales, wheezing or rhonchi  ABDOMEN: Soft, non-tender, non-distended MUSCULOSKELETAL:  No edema; No deformity  SKIN: Warm and dry NEUROLOGIC:  Alert and oriented x 3 PSYCHIATRIC:  Normal affect   ASSESSMENT:    New LBBB: unclear what the etiology of that episode was; possibly vasovagal. She did not show signs of ACS in the ED. To complete her work up, will obtain TTE to ensure no structural heart disease.   PLAN:    In order of problems listed above:  TTE Follow up if results abnormal           Medication Adjustments/Labs and Tests Ordered: Current medicines are reviewed at length with the patient today.  Concerns regarding medicines are outlined above.  No orders of the defined types were placed in this encounter.  No orders of the defined types were placed in this encounter.   There are no Patient Instructions on file for this visit.   Signed, Maisie Fus, MD  06/29/2021 7:44 AM    [facility]

## 2021-06-29 NOTE — Patient Instructions (Signed)
Medication Instructions:  No Changes In Medications at this time.  *If you need a refill on your cardiac medications before your next appointment, please call your pharmacy*  Lab Work: None Ordered At This Time.  If you have labs (blood work) drawn today and your tests are completely normal, you will receive your results only by: MyChart Message (if you have MyChart) OR A paper copy in the mail If you have any lab test that is abnormal or we need to change your treatment, we will call you to review the results.  Testing/Procedures: Your physician has requested that you have an echocardiogram. Echocardiography is a painless test that uses sound waves to create images of your heart. It provides your doctor with information about the size and shape of your heart and how well your heart's chambers and valves are working. You may receive an ultrasound enhancing agent through an IV if needed to better visualize your heart during the echo.This procedure takes approximately one hour. There are no restrictions for this procedure. This will take place at the [address], [address].   Follow-Up: At [facility], you and your health needs are our priority.  As part of our continuing mission to provide you with exceptional heart care, we have created designated Provider Care Teams.  These Care Teams include your primary Cardiologist (physician) and Advanced Practice Providers (APPs -  Physician Assistants and Nurse Practitioners) who all work together to provide you with the care you need, when you need it.  Your next appointment:   AS NEEDED   The format for your next appointment:   In Person  Provider:   Branch, Mary E, MD          

## 2021-07-19 ENCOUNTER — Ambulatory Visit (HOSPITAL_COMMUNITY): Payer: BC Managed Care – PPO | Attending: Internal Medicine

## 2021-07-19 DIAGNOSIS — R000 Tachycardia, unspecified: Secondary | ICD-10-CM | POA: Insufficient documentation

## 2021-07-19 DIAGNOSIS — R9431 Abnormal electrocardiogram [ECG] [EKG]: Secondary | ICD-10-CM | POA: Insufficient documentation

## 2021-07-19 LAB — ECHOCARDIOGRAM COMPLETE
Area-P 1/2: 3.05 cm2
S' Lateral: 2.3 cm

## 2021-08-25 ENCOUNTER — Encounter: Payer: Self-pay | Admitting: Internal Medicine

## 2021-08-25 ENCOUNTER — Telehealth: Payer: Self-pay | Admitting: Podiatry

## 2021-08-25 NOTE — Telephone Encounter (Signed)
Notified pt that Dr Ralene Cork did not see any concerns for infection and will hold off on the antibiotics for now. But Dr Ralene Cork did mention doing epsom salt soaks and neosporin and a bandaid if needed. Pt said thank you to me for letting her email me the photos and for calling back she is going to go get some epsom salt to try the soaks until her appt on 8.1.2023.Marland Kitchen

## 2021-08-25 NOTE — Telephone Encounter (Signed)
Pt stated she has an infection on both Great toes and would like an Rx for infection. Pt appointment is 8/16 1:15pm  Please advise

## 2021-08-25 NOTE — Telephone Encounter (Signed)
Pt called back and is not able to send pictures thru my chart to Dr Ralene Cork as she has not seen her yet. Pt is going to email to me and I will forward them to Dr Ralene Cork.

## 2021-08-25 NOTE — Telephone Encounter (Signed)
We will need to see what the toe looks like before prescribing anything. If needed she should be scheduled sooner or in meantime try epsom salt soaks and neosporin and a band aid.

## 2021-08-25 NOTE — Telephone Encounter (Signed)
I do not see any concern for infection in the toes so will hold off on antibiotics for now.

## 2021-08-25 NOTE — Telephone Encounter (Signed)
Perfect thank you!

## 2021-08-31 ENCOUNTER — Encounter: Payer: Self-pay | Admitting: Podiatry

## 2021-08-31 ENCOUNTER — Ambulatory Visit: Payer: BC Managed Care – PPO | Admitting: Podiatry

## 2021-08-31 DIAGNOSIS — Z79899 Other long term (current) drug therapy: Secondary | ICD-10-CM

## 2021-08-31 DIAGNOSIS — B351 Tinea unguium: Secondary | ICD-10-CM | POA: Diagnosis not present

## 2021-08-31 DIAGNOSIS — L84 Corns and callosities: Secondary | ICD-10-CM | POA: Diagnosis not present

## 2021-08-31 DIAGNOSIS — M79675 Pain in left toe(s): Secondary | ICD-10-CM

## 2021-08-31 DIAGNOSIS — M79674 Pain in right toe(s): Secondary | ICD-10-CM

## 2021-08-31 LAB — HM MAMMOGRAPHY

## 2021-08-31 MED ORDER — TERBINAFINE HCL 250 MG PO TABS: 250.0000 mg | ORAL_TABLET | Freq: Every day | ORAL | 0 refills | Status: DC

## 2021-08-31 NOTE — Progress Notes (Signed)
  Subjective:  Patient ID: Danielle Gamble, female    DOB: 04/25/1960,   MRN: [id number]  Chief Complaint  Patient presents with   Callouses     Right foot callus trim , bilateral great toe nail check ( fungus ) thickness in toenails , dicoloration    61 y.o. female presents for concern of bilateral toenail fungus and concern for right foot callus. She has been in the care of Dr. Logan Bores and been on lamisil several times in the past. More recently had new liver studies done and were normal. Also relates a right foot callus that she would like trimmed.  . Denies any other pedal complaints. Denies n/v/f/c.   Past Medical History:  Diagnosis Date   Allergic rhinitis    seasonal   Hyperlipidemia    Multinodular goiter (nontoxic)    Osteopenia 10/2018   T score -1.9 FRAX 8.7% / 0.9%   Ovarian cyst    PMH of   Seizure disorder (HCC)    Dr Sandria Manly    Objective:  Physical Exam: Vascular: DP/PT pulses 2/4 bilateral. CFT <3 seconds. Normal hair growth on digits. No edema.  Skin. No lacerations or abrasions bilateral feet. Bilateral hallux nails are thickened and discolored. Proximal nail appears clear and normal in appearance. Hyperkeratotic tissue noted sub fifth metatarsal on the right.   Musculoskeletal: MMT 5/5 bilateral lower extremities in DF, PF, Inversion and Eversion. Deceased ROM in DF of ankle joint.  Neurological: Sensation intact to light touch.   Assessment:   1. Encounter for long-term current use of medication   2. Pain due to onychomycosis of toenails of both feet   3. Callus of foot      Plan:  Patient was evaluated and treated and all questions answered. -Patient was evaluated. -Excisional debridement of keratotic lesion using a chisel blade was performed without incident. Salinocaine applied and light dressing placed. -Mechanical debridement of great toenails bilaterally performed using a nail nipper. Filed with dremel without incident.  -Patient is requesting  another refill of her Lamisil.  Labs from 3/23 showed normal LFTs.   Lamisil refilled.  Return in 3 months for recheck of nails.   Louann Sjogren, DPM

## 2021-09-05 ENCOUNTER — Encounter: Payer: Self-pay | Admitting: Internal Medicine

## 2021-09-15 ENCOUNTER — Ambulatory Visit: Payer: BC Managed Care – PPO | Admitting: Podiatry

## 2021-09-23 ENCOUNTER — Encounter: Payer: Self-pay | Admitting: Internal Medicine

## 2021-09-23 NOTE — Progress Notes (Signed)
Outside notes received. Information abstracted. Notes sent to scan.  

## 2021-12-01 ENCOUNTER — Ambulatory Visit: Payer: BC Managed Care – PPO | Admitting: Podiatry

## 2021-12-01 DIAGNOSIS — B351 Tinea unguium: Secondary | ICD-10-CM

## 2021-12-01 NOTE — Progress Notes (Signed)
   Chief Complaint  Patient presents with   Callouses    3 mth f/u callus to ball of foot of right foot. Painful to walk on.    Nail Problem    Nail fungus to bilateral hallux. Patient is currently taking Lamisil and she is needed another prescription because she is going to run out of the medication. She has has lab work done by her provider for her physical.     Subjective: 61 year old female presenting today for follow-up evaluation of onychomycosis and painful callus to the right foot this been present for several years.  Last visit the callus was debrided and she did feel some relief with that.  Patient states that there has been some improvement with Lamisil.  She is hoping to have 1 final refill the Lamisil.  Past Medical History:  Diagnosis Date   Allergic rhinitis    seasonal   Hyperlipidemia    Multinodular goiter (nontoxic)    Osteopenia 10/2018   T score -1.9 FRAX 8.7% / 0.9%   Ovarian cyst    PMH of   Seizure disorder ([place])    Dr Erling Cruz    Objective: Physical Exam General: The patient is alert and oriented x3 in no acute distress.  Dermatology: Overall improvement of the bilateral great toenails.  The bases of the nails appear to be healthy and growing out nicely.  Hyperkeratotic lesion present on the right foot x 1. Pain on palpation with a central nucleated core noted. Skin is warm, dry and supple bilateral lower extremities. Negative for open lesions or macerations.  Vascular: Palpable pedal pulses bilaterally. No edema or erythema noted. Capillary refill within normal limits.  Neurological: Epicritic and protective threshold grossly intact bilaterally.   Musculoskeletal Exam: Range of motion within normal limits to all pedal and ankle joints bilateral. Muscle strength 5/5 in all groups bilateral.   Assessment: #1 onychomycosis bilateral great toenails; improved #2 Porokeratosis right foot x 1  Plan of Care:  #1 Patient was evaluated. #2 Excisional  debridement of keratotic lesion(s) using a chisel blade was performed without incident. Salinocaine applied and light dressing placed. #3  Patient states that recent blood work WNL for hepatic function.  Refill prescription for Lamisil 2 and 50 mg #90 daily #4 return to clinic 6 months  Edrick Kins, DPM [facility]  Dr. Edrick Kins, DPM    [address], [place] [address]                Office [phone]  Fax [phone]

## 2021-12-04 ENCOUNTER — Encounter: Payer: Self-pay | Admitting: Podiatry

## 2021-12-05 ENCOUNTER — Other Ambulatory Visit: Payer: Self-pay | Admitting: Podiatry

## 2021-12-05 MED ORDER — TERBINAFINE HCL 250 MG PO TABS: 250.0000 mg | ORAL_TABLET | Freq: Every day | ORAL | 0 refills | Status: DC

## 2022-02-10 ENCOUNTER — Other Ambulatory Visit: Payer: Self-pay | Admitting: Internal Medicine

## 2022-04-14 ENCOUNTER — Other Ambulatory Visit: Payer: Self-pay | Admitting: Podiatry

## 2022-04-14 ENCOUNTER — Other Ambulatory Visit: Payer: Self-pay | Admitting: Internal Medicine

## 2022-05-04 ENCOUNTER — Other Ambulatory Visit: Payer: Self-pay | Admitting: Internal Medicine

## 2022-05-04 ENCOUNTER — Other Ambulatory Visit: Payer: Self-pay

## 2022-05-04 ENCOUNTER — Other Ambulatory Visit: Payer: Self-pay | Admitting: Podiatry

## 2022-06-08 ENCOUNTER — Ambulatory Visit: Payer: BC Managed Care – PPO | Admitting: Nurse Practitioner

## 2022-06-23 ENCOUNTER — Other Ambulatory Visit (HOSPITAL_COMMUNITY)
Admission: RE | Admit: 2022-06-23 | Discharge: 2022-06-23 | Disposition: A | Discharge status: Home / Self Care | Payer: BC Managed Care – PPO | Source: Ambulatory Visit | Attending: Nurse Practitioner | Admitting: Nurse Practitioner

## 2022-06-23 ENCOUNTER — Encounter: Payer: Self-pay | Admitting: Nurse Practitioner

## 2022-06-23 ENCOUNTER — Ambulatory Visit (INDEPENDENT_AMBULATORY_CARE_PROVIDER_SITE_OTHER): Payer: BC Managed Care – PPO | Admitting: Nurse Practitioner

## 2022-06-23 VITALS — BP 120/74 | Ht 68.25 in | Wt 178.0 lb

## 2022-06-23 DIAGNOSIS — Z780 Asymptomatic menopausal state: Secondary | ICD-10-CM | POA: Diagnosis not present

## 2022-06-23 DIAGNOSIS — Z124 Encounter for screening for malignant neoplasm of cervix: Secondary | ICD-10-CM

## 2022-06-23 DIAGNOSIS — M8589 Other specified disorders of bone density and structure, multiple sites: Secondary | ICD-10-CM

## 2022-06-23 DIAGNOSIS — Z01419 Encounter for gynecological examination (general) (routine) without abnormal findings: Secondary | ICD-10-CM

## 2022-06-23 NOTE — Progress Notes (Signed)
   Danielle Gamble 02-22-1960 [id number]   History:  62 y.o. G0 presents for annual exam. Postmenopausal - no HRT, no bleeding. Normal pap history. H/O right ovarian cystectomy in 77s. Seizure disorder managed by neurology, HLD managed by PCP.   Gynecologic History No LMP recorded. Patient is postmenopausal.   Contraception/Family planning: post menopausal status Sexually active: Yes  Health Maintenance Last Pap: 10/26/2017. Results were: Normal neg HPV, 5-year repeat Last mammogram: 08/31/2021. Results were: Normal Last colonoscopy: 07/19/2021. Results were: Polyp, 5-year recall Last Dexa: 10/22/2018. Results were: T-score-1.9, FRAX 8.7% / 0.9%  Past medical history, past surgical history, family history and social history were all reviewed and documented in the EPIC chart. Married. 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 Traveling.   ROS:  A ROS was performed and pertinent positives and negatives are included.  Exam:  Vitals:   06/23/22 0921  BP: 120/74  Weight: 178 lb (80.7 kg)  Height: 5' 8.25" (1.734 m)    Body mass index is 26.87 kg/m.  General appearance:  Normal Thyroid:  Symmetrical, normal in size, without palpable masses or nodularity. Respiratory  Auscultation:  Clear without wheezing or rhonchi Cardiovascular  Auscultation:  Regular rate, without rubs, murmurs or gallops  Edema/varicosities:  Not grossly evident Abdominal  Soft,nontender, without masses, guarding or rebound.  Liver/spleen:  No organomegaly noted  Hernia:  None appreciated  Skin  Inspection:  Grossly normal Breasts: Examined lying and sitting.   Right: Without masses, retractions, nipple discharge or axillary adenopathy.   Left: Without masses, retractions, nipple discharge or axillary adenopathy. Genitourinary   Inguinal/mons:  Normal without inguinal adenopathy  External genitalia:  Normal appearing vulva with no masses, tenderness, or lesions  BUS/Urethra/Skene's glands:   Normal  Vagina:  Normal appearing with normal color and discharge, no lesions. Atrophic changes  Cervix:  Normal appearing without discharge or lesions  Uterus:  Normal in size, shape and contour.  Midline and mobile, nontender  Adnexa/parametria:     Rt: Normal in size, without masses or tenderness.   Lt: Normal in size, without masses or tenderness.  Anus and perineum: Normal  Digital rectal exam: Deferred  Patient informed chaperone available to be present for breast and pelvic exam. Patient has requested no chaperone to be present. Patient has been advised what will be completed during breast and pelvic exam.   Assessment/Plan:  62 y.o. G0 for annual exam.   Well female exam with routine gynecological exam - Education provided on SBEs, importance of preventative screenings, current guidelines, high calcium diet, regular exercise, and multivitamin daily.  Labs with PCP.   Postmenopausal - no HRT, no bleeding  Osteopenia of multiple sites - T-score -1.9 without elevated FRAX. Continue Vitamin D + Calcium supplement and increase exercise. Thinks she had DXA more recently than 2020. Will check on this and send [place] report.   Screening for cervical cancer - Plan: Cytology - PAP( [place]). Normal pap history.   Screening for breast cancer - Normal mammogram history.  Continue annual screenings.  Normal breast exam today.  Screening for colon cancer - 2023 colonoscopy. Will repeat at 5-year interval per GI's recommendation.   Return in 1 year for annual.     Olivia Mackie DNP, 9:54 AM 06/23/2022

## 2022-06-30 LAB — CYTOLOGY - PAP
Comment: NEGATIVE
Diagnosis: NEGATIVE
High risk HPV: NEGATIVE

## 2022-10-17 ENCOUNTER — Encounter: Payer: Self-pay | Admitting: Neurology

## 2022-10-17 ENCOUNTER — Ambulatory Visit: Payer: BC Managed Care – PPO | Admitting: Neurology

## 2022-10-17 VITALS — BP 132/84 | HR 94 | Ht 68.0 in | Wt 179.5 lb

## 2022-10-17 DIAGNOSIS — G40301 Generalized idiopathic epilepsy and epileptic syndromes, not intractable, with status epilepticus: Secondary | ICD-10-CM | POA: Insufficient documentation

## 2022-10-17 MED ORDER — LAMOTRIGINE 100 MG PO TABS: 150.0000 mg | ORAL_TABLET | Freq: Two times a day (BID) | ORAL | 3 refills | Status: DC

## 2022-10-17 NOTE — Progress Notes (Addendum)
Chief Complaint  Patient presents with   Seizures    Rm12, husband ronnie present, BJ:YNWG one 10/13/22 bit tongue and lips denied hitting head at this time      ASSESSMENT AND PLAN  Danielle Gamble is a 62 y.o. female   Recurrent seizure,  Most recent seizure was on October 13, 2022  Repeat EEG  Laboratory evaluation including lamotrigine level,  Higher dose of lamotrigine 150 mg twice a day  Avoiding triggers, such as flashing light, sleep deprivation  Return To Clinic With NP In 12 Months    DIAGNOSTIC DATA (LABS, IMAGING, TESTING) - I reviewed patient records, labs, notes, testing and imaging myself where available. Reviewed emergency health service record [place], October 13, 2022, witnessed seizure approximately 3 minutes with postictal lasting 20 minutes,  MEDICAL HISTORY:  Danielle Gamble is a 62 years old right-handed Caucasian female, previously patient of Dr. Sandria Manly,  I saw her initially on March 17th 2015.   She had a history of seizure, first one was in 1995, no warning signs, generalized tonic-clonic seizure, with tongue biting, incontinence, a second seizure was in 1997, generalized, mild body shaking,   MRI of the brain in 1997 was normal, EEG was normal,   She was initially treated with Dilantin, has been on current dose of lamotrigine since 2011, 100 mg tablet, half tablet in the morning, 1 tablet every night, no side effect, doing very well, still driving, work full time as a Hospital doctor,    UPDATE Jan 18 2017: She had a seizure in August 2018, lamotrigine dose was increased to 100 mg twice a day, laboratory evaluations in August 2018, lamotrigine level 7.5, normal CBC, CMP showed mild elevated calcium 10.4.   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  PHYSICAL EXAMNIATION:  Gen: NAD, conversant, well nourised, well groomed                     Cardiovascular: Regular rate rhythm, no peripheral edema, warm, nontender. Eyes: Conjunctivae clear without exudates or hemorrhage Neck: Supple, no carotid bruits. Pulmonary: Clear to auscultation bilaterally   NEUROLOGICAL EXAM:  MENTAL STATUS: Speech/cognition: Awake, alert, oriented to history taking and casual conversation CRANIAL NERVES: CN II: Visual fields are full to confrontation. Pupils are round equal and briskly reactive to light. CN III, IV, VI: extraocular movement are normal. No ptosis. CN V: Facial sensation is intact to light  touch CN VII: Face is symmetric with normal eye closure  CN VIII: Hearing is normal to causal conversation. CN IX, X: Phonation is normal. CN XI: Head turning and shoulder shrug are intact  MOTOR: There is no pronator drift of out-stretched arms. Muscle bulk and tone are normal. Muscle strength is normal.  REFLEXES: Reflexes are 2+ and symmetric at the biceps, triceps, knees, and ankles. Plantar responses are flexor.  SENSORY: Intact to light touch, pinprick and vibratory  sensation are intact in fingers and toes.  COORDINATION: There is no trunk or limb dysmetria noted.  GAIT/STANCE: Posture is normal. Gait is steady with normal steps, base, arm swing, and turning. Heel and toe walking are normal. Tandem gait is normal.  Romberg is absent.  REVIEW OF SYSTEMS:  Full 14 system review of systems performed and notable only for as above All other review of systems were negative.   ALLERGIES: Allergies  Allergen Reactions   Other Other (See Comments)    IV medication given at ED 05/2021 turned arm red/purple/red    HOME MEDICATIONS: Current Outpatient Medications  Medication Sig Dispense Refill   aspirin 81 MG tablet Take 81 mg by mouth daily.     Calcium Carbonate (CALCIUM 500 PO) Take 500 mg by mouth daily.     lamoTRIgine (LAMICTAL) 100 MG tablet TAKE 1 TABLET BY MOUTH TWICE A DAY 180 tablet 2   loratadine (CLARITIN) 10 MG tablet Take 10 mg by mouth daily.     Melatonin 10 MG TABS Take 10 mg by mouth at bedtime.     naproxen sodium (ALEVE) 220 MG tablet Take 220 mg by mouth daily as needed (pain/headace).     rosuvastatin (CRESTOR) 10 MG tablet TAKE 1 TABLET BY MOUTH EVERY DAY 90 tablet 3   Triamcinolone Acetonide (NASACORT ALLERGY 24HR NA) Place 1 spray into both nostrils daily.     VITAMIN D PO Take 1,000 Units by mouth daily.     No current facility-administered medications for this visit.    PAST MEDICAL HISTORY: Past Medical History:  Diagnosis Date   Allergic rhinitis    seasonal   Hyperlipidemia    Multinodular goiter (nontoxic)    Osteopenia 10/2018   T score -1.9 FRAX 8.7% / 0.9%   Ovarian cyst    PMH of   Seizure disorder ([facility])    Dr Sandria Manly    PAST SURGICAL HISTORY: Past Surgical History:  Procedure Laterality Date   ABDOMINAL SURGERY     Expl. Laparotomy-Right ovar.cyst   BIOPSY THYROID  2015   Dr Elvera Lennox   COLONOSCOPY  2012   negative, F/U in 2022([facility])   OVARIAN CYST SURGERY     [facility]    Sternal Cystectomy      FAMILY HISTORY: Family History  Problem Relation Age of Onset   Alzheimer's disease Mother    Prostate cancer Father    Heart attack Father 1       CBAG; pacer   Stroke Father 66   Coronary artery disease Paternal Uncle     SOCIAL HISTORY: Social History   Socioeconomic History   Marital status: Married    Spouse name: [name]   Number of children: 0   Years of education: college   Highest education level: Not on file  Occupational History    Employer: [employer]  Tobacco Use   Smoking status: Never    Passive exposure: Never   Smokeless tobacco: Never  Vaping Use   Vaping  status: Never Used  Substance and Sexual Activity   Alcohol use: Yes    Alcohol/week: 1.0 standard drink of alcohol    Types: 1 Glasses of wine per week    Comment: socially on weekends   Drug use: No   Sexual activity: Not Currently    Partners: Male    Birth control/protection: Other-see comments, Post-menopausal    Comment: VASECTOMY-1st intercourse 62 yo-Fewer than 5 partners  Other Topics Concern   Not on file  Social History Narrative   Patient  Lives at home with her husband [name]) [name]    Patient works full time at [employer]   Right handed   Caffeine two cups daily   Social Determinants of Corporate investment banker Strain: Not on file  Food Insecurity: Not on file  Transportation Needs: Not on file  Physical Activity: Not on file  Stress: Not on file  Social Connections: Unknown (06/14/2021)   Received from [facility]   Social Network    Social Network: Not on file  Intimate Partner Violence: Unknown (05/06/2021)   [facility], [facility]   HITS    Physically Hurt: Not on file    Insult or Talk Down To: Not on file    Threaten Physical Harm: Not on file    Scream or Curse: Not on file      Levert Feinstein, M.D. Ph.D.  [facility] [address] [place], [place] [address] Ph: [phone] Fax: [phone]  CC:  Pincus Sanes, MD [address] [place],  [place] [address]  Pincus Sanes, MD

## 2022-10-18 ENCOUNTER — Encounter: Payer: Self-pay | Admitting: Internal Medicine

## 2022-10-18 LAB — CBC WITH DIFFERENTIAL/PLATELET
Basophils Absolute: 0 10*3/uL (ref 0.0–0.2)
Basos: 1 %
EOS (ABSOLUTE): 0.2 10*3/uL (ref 0.0–0.4)
Eos: 3 %
Hematocrit: 45.3 % (ref 34.0–46.6)
Hemoglobin: 15.2 g/dL (ref 11.1–15.9)
Immature Grans (Abs): 0 10*3/uL (ref 0.0–0.1)
Immature Granulocytes: 0 %
Lymphocytes Absolute: 2 10*3/uL (ref 0.7–3.1)
Lymphs: 31 %
MCH: 31.5 pg (ref 26.6–33.0)
MCHC: 33.6 g/dL (ref 31.5–35.7)
MCV: 94 fL (ref 79–97)
Monocytes Absolute: 0.4 10*3/uL (ref 0.1–0.9)
Monocytes: 6 %
Neutrophils Absolute: 3.9 10*3/uL (ref 1.4–7.0)
Neutrophils: 59 %
Platelets: 201 10*3/uL (ref 150–450)
RBC: 4.83 x10E6/uL (ref 3.77–5.28)
RDW: 12.8 % (ref 11.7–15.4)
WBC: 6.6 10*3/uL (ref 3.4–10.8)

## 2022-10-18 LAB — COMPREHENSIVE METABOLIC PANEL
ALT: 28 IU/L (ref 0–32)
AST: 28 IU/L (ref 0–40)
Albumin: 4.7 g/dL (ref 3.9–4.9)
Alkaline Phosphatase: 90 IU/L (ref 44–121)
BUN/Creatinine Ratio: 20 (ref 12–28)
BUN: 21 mg/dL (ref 8–27)
Bilirubin Total: <0.2 mg/dL (ref 0.0–1.2)
CO2: 24 mmol/L (ref 20–29)
Calcium: 10.5 mg/dL — ABNORMAL HIGH (ref 8.7–10.3)
Chloride: 103 mmol/L (ref 96–106)
Creatinine, Ser: 1.06 mg/dL — ABNORMAL HIGH (ref 0.57–1.00)
Globulin, Total: 2.5 g/dL (ref 1.5–4.5)
Glucose: 108 mg/dL — ABNORMAL HIGH (ref 70–99)
Potassium: 4.2 mmol/L (ref 3.5–5.2)
Sodium: 144 mmol/L (ref 134–144)
Total Protein: 7.2 g/dL (ref 6.0–8.5)
eGFR: 59 mL/min/{1.73_m2} — ABNORMAL LOW (ref >59)

## 2022-10-18 LAB — TSH: TSH: 1.18 u[IU]/mL (ref 0.450–4.500)

## 2022-10-18 LAB — LAMOTRIGINE LEVEL: Lamotrigine Lvl: 5.7 ug/mL (ref 2.0–20.0)

## 2022-11-08 ENCOUNTER — Other Ambulatory Visit: Payer: Self-pay | Admitting: Internal Medicine

## 2022-11-08 NOTE — Telephone Encounter (Signed)
Prescribed by neurology.

## 2022-11-09 ENCOUNTER — Ambulatory Visit: Payer: BC Managed Care – PPO | Admitting: Neurology

## 2022-11-09 ENCOUNTER — Other Ambulatory Visit: Payer: BC Managed Care – PPO | Admitting: *Deleted

## 2022-11-09 DIAGNOSIS — G40301 Generalized idiopathic epilepsy and epileptic syndromes, not intractable, with status epilepticus: Secondary | ICD-10-CM

## 2022-11-10 ENCOUNTER — Encounter: Payer: Self-pay | Admitting: Internal Medicine

## 2022-11-10 NOTE — Patient Instructions (Addendum)
Monitor BP and HR.      Blood work was ordered.   Have this done in about 2 months - fast for 6 months.    Medications changes include :   cough syrup for nighttime as needed.     Return in about 1 year (around 11/11/2023) for Physical Exam.    Health Maintenance, Female Adopting a healthy lifestyle and getting preventive care are important in promoting health and wellness. Ask your health care provider about: The right schedule for you to have regular tests and exams. Things you can do on your own to prevent diseases and keep yourself healthy. What should I know about diet, weight, and exercise? Eat a healthy diet  Eat a diet that includes plenty of vegetables, fruits, low-fat dairy products, and lean protein. Do not eat a lot of foods that are high in solid fats, added sugars, or sodium. Maintain a healthy weight Body mass index (BMI) is used to identify weight problems. It estimates body fat based on height and weight. Your health care provider can help determine your BMI and help you achieve or maintain a healthy weight. Get regular exercise Get regular exercise. This is one of the most important things you can do for your health. Most adults should: Exercise for at least 150 minutes each week. The exercise should increase your heart rate and make you sweat (moderate-intensity exercise). Do strengthening exercises at least twice a week. This is in addition to the moderate-intensity exercise. Spend less time sitting. Even light physical activity can be beneficial. Watch cholesterol and blood lipids Have your blood tested for lipids and cholesterol at 64 years of age, then have this test every 5 years. Have your cholesterol levels checked more often if: Your lipid or cholesterol levels are high. You are older than 64 years of age. You are at high risk for heart disease. What should I know about cancer screening? Depending on your health history and family history, you  may need to have cancer screening at various ages. This may include screening for: Breast cancer. Cervical cancer. Colorectal cancer. Skin cancer. Lung cancer. What should I know about heart disease, diabetes, and high blood pressure? Blood pressure and heart disease High blood pressure causes heart disease and increases the risk of stroke. This is more likely to develop in people who have high blood pressure readings or are overweight. Have your blood pressure checked: Every 3-5 years if you are 22-32 years of age. Every year if you are 59 years old or older. Diabetes Have regular diabetes screenings. This checks your fasting blood sugar level. Have the screening done: Once every three years after age 49 if you are at a normal weight and have a low risk for diabetes. More often and at a younger age if you are overweight or have a high risk for diabetes. What should I know about preventing infection? Hepatitis B If you have a higher risk for hepatitis B, you should be screened for this virus. Talk with your health care provider to find out if you are at risk for hepatitis B infection. Hepatitis C Testing is recommended for: Everyone born from [birth year] through 1967. Anyone with known risk factors for hepatitis C. Sexually transmitted infections (STIs) Get screened for STIs, including gonorrhea and chlamydia, if: You are sexually active and are younger than 64 years of age. You are older than 64 years of age and your health care provider tells you that you are at risk for  this type of infection. Your sexual activity has changed since you were last screened, and you are at increased risk for chlamydia or gonorrhea. Ask your health care provider if you are at risk. Ask your health care provider about whether you are at high risk for HIV. Your health care provider may recommend a prescription medicine to help prevent HIV infection. If you choose to take medicine to prevent HIV, you should first  get tested for HIV. You should then be tested every 3 months for as long as you are taking the medicine. Pregnancy If you are about to stop having your period (premenopausal) and you may become pregnant, seek counseling before you get pregnant. Take 400 to 800 micrograms (mcg) of folic acid every day if you become pregnant. Ask for birth control (contraception) if you want to prevent pregnancy. Osteoporosis and menopause Osteoporosis is a disease in which the bones lose minerals and strength with aging. This can result in bone fractures. If you are 59 years old or older, or if you are at risk for osteoporosis and fractures, ask your health care provider if you should: Be screened for bone loss. Take a calcium or vitamin D supplement to lower your risk of fractures. Be given hormone replacement therapy (HRT) to treat symptoms of menopause. Follow these instructions at home: Alcohol use Do not drink alcohol if: Your health care provider tells you not to drink. You are pregnant, may be pregnant, or are planning to become pregnant. If you drink alcohol: Limit how much you have to: 0-1 drink a day. Know how much alcohol is in your drink. In the [place] one drink equals one 12 oz bottle of beer (355 mL), one 5 oz glass of wine (148 mL), or one 1 oz glass of hard liquor (44 mL). Lifestyle Do not use any products that contain nicotine or tobacco. These products include cigarettes, chewing tobacco, and vaping devices, such as e-cigarettes. If you need help quitting, ask your health care provider. Do not use street drugs. Do not share needles. Ask your health care provider for help if you need support or information about quitting drugs. General instructions Schedule regular health, dental, and eye exams. Stay current with your vaccines. Tell your health care provider if: You often feel depressed. You have ever been abused or do not feel safe at home. Summary Adopting a healthy lifestyle and  getting preventive care are important in promoting health and wellness. Follow your health care provider's instructions about healthy diet, exercising, and getting tested or screened for diseases. Follow your health care provider's instructions on monitoring your cholesterol and blood pressure. This information is not intended to replace advice given to you by your health care provider. Make sure you discuss any questions you have with your health care provider. Document Revised: 06/08/2020 Document Reviewed: 06/08/2020 Elsevier Patient Education  2024 [employer].

## 2022-11-10 NOTE — Progress Notes (Signed)
Subjective:    Patient ID: Danielle Gamble, female    DOB: May 07, 1960, 62 y.o.   MRN: [id number]      HPI Danielle Gamble is here for a Physical exam and her chronic medical problems.     Takes aleve as needed.  Does not take anything on a regular basis  Had URI a few weeks ago - still has a dry cough.  Periodically there is a little mucus.  She feels the need to cough by a feeling in her throat.  Uses nasacort.  Cough slowly getting better.     Medications and allergies reviewed with patient and updated if appropriate.  Current Outpatient Medications on File Prior to Visit  Medication Sig Dispense Refill   aspirin 81 MG tablet Take 81 mg by mouth daily.     Calcium Carbonate (CALCIUM 500 PO) Take 1,200 mg by mouth daily. Patient has osteopenia     lamoTRIgine (LAMICTAL) 100 MG tablet Take 1.5 tablets (150 mg total) by mouth 2 (two) times daily. 270 tablet 3   loratadine (CLARITIN) 10 MG tablet Take 10 mg by mouth daily.     Melatonin 10 MG TABS Take 10 mg by mouth at bedtime.     naproxen sodium (ALEVE) 220 MG tablet Take 220 mg by mouth daily as needed (pain/headace).     Triamcinolone Acetonide (NASACORT ALLERGY 24HR NA) Place 1 spray into both nostrils daily.     VITAMIN D PO Take 1,000 Units by mouth daily.     No current facility-administered medications on file prior to visit.    Review of Systems  Constitutional:  Negative for fever.  HENT:  Positive for hearing loss (chronic- now wearing hearing aids) and postnasal drip.   Eyes:  Negative for visual disturbance.  Respiratory:  Positive for cough (residual from URI). Negative for shortness of breath and wheezing.   Cardiovascular:  Negative for chest pain, palpitations and leg swelling.  Gastrointestinal:  Positive for constipation (occ). Negative for abdominal pain, blood in stool and diarrhea.       No gerd  Genitourinary:  Negative for dysuria.  Musculoskeletal:  Negative for arthralgias and back pain.  Skin:   Negative for rash.  Neurological:  Negative for light-headedness and headaches.  Psychiatric/Behavioral:  Negative for dysphoric mood. The patient is not nervous/anxious.        Objective:   Vitals:   11/11/22 1318  BP: 136/84  Pulse: 100  Temp: 98.7 F (37.1 C)  SpO2: 98%   Filed Weights   11/11/22 1318  Weight: 177 lb (80.3 kg)   Body mass index is 26.91 kg/m.  BP Readings from Last 3 Encounters:  11/11/22 136/84  10/17/22 132/84  06/23/22 120/74    Wt Readings from Last 3 Encounters:  11/11/22 177 lb (80.3 kg)  10/17/22 179 lb 8 oz (81.4 kg)  06/23/22 178 lb (80.7 kg)       Physical Exam Constitutional: She appears well-developed and well-nourished. No distress.  HENT:  Head: Normocephalic and atraumatic.  Right Ear: External ear normal. Normal ear canal and TM Left Ear: External ear normal.  Normal ear canal and TM Mouth/Throat: Oropharynx is clear and moist.  Eyes: Conjunctivae normal.  Neck: Neck supple. No tracheal deviation present. No thyromegaly present.  Right lower lobe nodule No carotid bruit  Cardiovascular: Normal rate, regular rhythm and normal heart sounds.   No murmur heard.  No edema. Pulmonary/Chest: Effort normal and breath sounds normal. No respiratory distress. She  has no wheezes. She has no rales.  Breast: deferred   Abdominal: Soft. She exhibits no distension. There is no tenderness.  Lymphadenopathy: She has no cervical adenopathy.  Skin: Skin is warm and dry. She is not diaphoretic.  Psychiatric: She has a normal mood and affect. Her behavior is normal.     Lab Results  Component Value Date   WBC 6.6 10/17/2022   HGB 15.2 10/17/2022   HCT 45.3 10/17/2022   PLT 201 10/17/2022   GLUCOSE 108 (H) 10/17/2022   CHOL 182 04/29/2021   TRIG 360.0 (H) 04/29/2021   HDL 58.20 04/29/2021   LDLDIRECT 91.0 04/29/2021   LDLCALC 103 (H) 05/23/2014   ALT 28 10/17/2022   AST 28 10/17/2022   NA 144 10/17/2022   K 4.2 10/17/2022   CL 103  10/17/2022   CREATININE 1.06 (H) 10/17/2022   BUN 21 10/17/2022   CO2 24 10/17/2022   TSH 1.180 10/17/2022   HGBA1C 5.5 05/22/2019         Assessment & Plan:   Physical exam: Screening blood work  ordered Exercise  some  - knows she could do more Weight  is ok Substance abuse  none Improving diet, stopped drinking   Reviewed recommended immunizations.   Health Maintenance  Topic Date Due   DTaP/Tdap/Td (2 - Tdap) 09/02/2019   DEXA SCAN  10/21/2021   COVID-19 Vaccine (6 - 2023-24 season) 12/20/2022   MAMMOGRAM  09/01/2023   Colonoscopy  11/18/2024   Cervical Cancer Screening (HPV/Pap Cotest)  06/23/2027   INFLUENZA VACCINE  Completed   Hepatitis C Screening  Completed   HIV Screening  Completed   Zoster Vaccines- Shingrix  Completed   HPV VACCINES  Aged Out          See Problem List for Assessment and Plan of chronic medical problems.

## 2022-11-11 ENCOUNTER — Ambulatory Visit: Payer: BC Managed Care – PPO | Admitting: Internal Medicine

## 2022-11-11 ENCOUNTER — Encounter: Payer: Self-pay | Admitting: Internal Medicine

## 2022-11-11 VITALS — BP 136/84 | HR 100 | Temp 98.7°F | Ht 68.0 in | Wt 177.0 lb

## 2022-11-11 DIAGNOSIS — M85851 Other specified disorders of bone density and structure, right thigh: Secondary | ICD-10-CM

## 2022-11-11 DIAGNOSIS — E7849 Other hyperlipidemia: Secondary | ICD-10-CM

## 2022-11-11 DIAGNOSIS — R7303 Prediabetes: Secondary | ICD-10-CM | POA: Insufficient documentation

## 2022-11-11 DIAGNOSIS — Z0000 Encounter for general adult medical examination without abnormal findings: Secondary | ICD-10-CM | POA: Diagnosis not present

## 2022-11-11 DIAGNOSIS — G40309 Generalized idiopathic epilepsy and epileptic syndromes, not intractable, without status epilepticus: Secondary | ICD-10-CM | POA: Diagnosis not present

## 2022-11-11 DIAGNOSIS — R739 Hyperglycemia, unspecified: Secondary | ICD-10-CM

## 2022-11-11 DIAGNOSIS — R052 Subacute cough: Secondary | ICD-10-CM

## 2022-11-11 DIAGNOSIS — R7989 Other specified abnormal findings of blood chemistry: Secondary | ICD-10-CM | POA: Insufficient documentation

## 2022-11-11 DIAGNOSIS — R059 Cough, unspecified: Secondary | ICD-10-CM | POA: Insufficient documentation

## 2022-11-11 MED ORDER — HYDROCODONE BIT-HOMATROP MBR 5-1.5 MG/5ML PO SOLN: 5.0000 mL | Freq: Three times a day (TID) | ORAL | 0 refills | Status: DC | PRN

## 2022-11-11 MED ORDER — ROSUVASTATIN CALCIUM 10 MG PO TABS: 10.0000 mg | ORAL_TABLET | Freq: Every day | ORAL | 3 refills | Status: DC

## 2022-11-11 NOTE — Assessment & Plan Note (Addendum)
Chronic Regular exercise and healthy diet encouraged Check lipid panel, CMP Continue rosuvastatin 10 mg daily

## 2022-11-11 NOTE — Assessment & Plan Note (Signed)
Mild Takes Aleve on occasion, but nothing regularly She is working on hydration Recheck CMP in 2 months Advised her to monitor her BP and heart rate at home

## 2022-11-11 NOTE — Assessment & Plan Note (Signed)
Chronic Check a1c Low sugar / carb diet Stressed regular exercise  

## 2022-11-11 NOTE — Assessment & Plan Note (Addendum)
Chronic DEXA due-ordered for [facility] so she can have it done where she gets her mammogram regular exercise, calcium vitamin D supplementation

## 2022-11-11 NOTE — Assessment & Plan Note (Signed)
Chronic Had her first seizure in a very long time recently-possibly related to fatigue, stress Has reestablished with neurology Taking Lamictal 150 mg twice daily

## 2022-11-11 NOTE — Assessment & Plan Note (Signed)
Subacute High recent URI this is residual from that possibly also in addition to some fall allergies Cough is slowly improving and does sound postviral If the cough does not completely resolve she will let me know

## 2022-11-28 ENCOUNTER — Telehealth: Payer: Self-pay | Admitting: Neurology

## 2022-11-28 NOTE — Telephone Encounter (Signed)
Phone rm Advise pt not ready yet and will contact as soon as they are ready and reviewed and recommendations are made by the doctor Thanks,  Loni Delbridge

## 2022-11-28 NOTE — Telephone Encounter (Signed)
Pt has been called and informed. Pt verbalized appreciation.

## 2022-11-28 NOTE — Telephone Encounter (Signed)
Pt asking to be called with results to EEG from 9th of Oct

## 2022-12-05 ENCOUNTER — Encounter: Payer: Self-pay | Admitting: Neurology

## 2022-12-06 NOTE — Procedures (Signed)
   HISTORY: 62 year old female with recurrent  TECHNIQUE:  This is a routine 16 channel EEG recording with one channel devoted to a limited EKG recording.  It was performed during wakefulness, drowsiness and asleep.  Hyperventilation and photic stimulation were performed as activating procedures.  There are minimum muscle and movement artifact noted.  Upon maximum arousal, posterior dominant waking rhythm consistent of mildly dysrhythmic alpha range activity. Activities are symmetric over the bilateral posterior derivations and attenuated with eye opening.  Photic stimulation did not alter the tracing.  Hyperventilation produced mild/moderate buildup with higher amplitude and the slower activities noted.  During EEG recording, patient developed drowsiness and entered sleep, sleep EEG demonstrated architecture, there were frontal centrally dominant vertex waves and symmetric sleep spindles noted.  During EEG recording, there was no epileptiform discharge noted.  There were occasionally T7 and T8 sharp transient  EKG demonstrate normal sinus rhythm.  CONCLUSION: This is a  normal awake EEG.  There is no electrodiagnostic evidence of epileptiform discharge.  Levert Feinstein, M.D. Ph.D.  [facility] [address] [place], [place] [address] Phone: [phone] Fax:      [phone]

## 2022-12-08 ENCOUNTER — Encounter: Payer: Self-pay | Admitting: Neurology

## 2022-12-08 ENCOUNTER — Telehealth: Payer: Self-pay

## 2022-12-08 NOTE — Telephone Encounter (Signed)
Order faxed today.

## 2022-12-13 ENCOUNTER — Encounter: Payer: Self-pay | Admitting: Internal Medicine

## 2022-12-23 NOTE — Telephone Encounter (Signed)
Spoke with Danielle Gamble to discuss upcoming appointment with Dr. Wyline Mood on 12/27/22. Danielle Gamble reports that she experienced a seizure while in [place] in 10/2022 and is concerned about the cardiac "connection" based on some of the assessment findings at that time and her family history (her father was diagnosed with cardiac disease in his 40s). She has a paper copy of her records from [place], which she agrees to try to scan in and send via MyChart in advance of her appointment for review by Dr. Wyline Mood. Verbal instructions provided.  Danielle Gamble requests to keep the current appointment to discuss her concerns in person with Dr. Wyline Mood. She expressed appreciation of call and denies any questions at this time.

## 2022-12-25 ENCOUNTER — Encounter: Payer: Self-pay | Admitting: Internal Medicine

## 2022-12-26 ENCOUNTER — Telehealth: Payer: Self-pay | Admitting: Internal Medicine

## 2022-12-26 NOTE — Telephone Encounter (Signed)
Noted  Patients MyChart message sent to Dr. Wyline Mood as FYI for 11/26 appointment  Mychart message sent to patient

## 2022-12-26 NOTE — Telephone Encounter (Signed)
Patient called to make Dr. Wyline Mood aware of a MyChart message to sent. Pt has appt tomorrow with Dr. Wyline Mood.

## 2022-12-27 ENCOUNTER — Encounter: Payer: Self-pay | Admitting: Internal Medicine

## 2022-12-27 ENCOUNTER — Ambulatory Visit: Payer: BC Managed Care – PPO | Attending: Internal Medicine | Admitting: Internal Medicine

## 2022-12-27 VITALS — BP 138/82 | HR 99 | Ht 68.0 in | Wt 177.0 lb

## 2022-12-27 DIAGNOSIS — R739 Hyperglycemia, unspecified: Secondary | ICD-10-CM

## 2022-12-27 DIAGNOSIS — R202 Paresthesia of skin: Secondary | ICD-10-CM | POA: Diagnosis not present

## 2022-12-27 NOTE — Progress Notes (Signed)
Cardiology Office Note:    Date:  12/27/2022   ID:  Danielle Gamble, DOB [birth year], MRN [id number]  PCP:  Pincus Sanes, MD   [facility] Providers Cardiologist:  Maisie Fus, MD     Referring MD: Pincus Sanes, MD   No chief complaint on file. Near syncope  History of Present Illness:    Danielle Gamble is a 62 y.o. female with a hx of seizure disorder, recent visit to the ED after sudden onset dizziness and weakness. [facility] and [facility] came. ECG showed sinus tachycardia and LBBB.  Negative sgarbossa.  Troponin was negative. Prior CAC of 0 in 2021. Today, she said she felt well prior to this. She did not LOC during this episode. No seizure activity. Her blood glucose was 92. She has felt better. She has noted some heart fluttering. Father was 40 with coronary disease with bypass and stroke. Otherwise, she has no cardiac dx history.  Interval hx 12/27/2022 Danielle Gamble was in [place] and she had a seizure.  She has history of seizures.  EMS saw her and an ECG which showed a left bundle Danielle Gamble block.  Without having records they did not realize that this is not new.  She has had an echocardiogram that shows normal LV function and no wall motion abnormalities except for abnormal septal motion which can be seen with left bundle Danielle Gamble block  Past Medical History:  Diagnosis Date   Allergic rhinitis    seasonal   Hyperlipidemia    Multinodular goiter (nontoxic)    Osteopenia 10/2018   T score -1.9 FRAX 8.7% / 0.9%   Ovarian cyst    PMH of   Seizure disorder [facility])    Dr Sandria Manly    Past Surgical History:  Procedure Laterality Date   ABDOMINAL SURGERY     Expl. Laparotomy-Right ovar.cyst   BIOPSY THYROID  2015   Dr Elvera Lennox   COLONOSCOPY  2012   negative, F/U in 2022([facility])   OVARIAN CYST SURGERY     done in [facility]   Sternal Cystectomy      Current Medications: Current Outpatient Medications on File Prior to Visit  Medication Sig Dispense Refill    aspirin 81 MG tablet Take 81 mg by mouth daily.     Calcium Carbonate (CALCIUM 500 PO) Take 1,200 mg by mouth daily. Patient has osteopenia     HYDROcodone bit-homatropine (HYCODAN) 5-1.5 MG/5ML syrup Take 5 mLs by mouth every 8 (eight) hours as needed for cough. 120 mL 0   lamoTRIgine (LAMICTAL) 100 MG tablet Take 1.5 tablets (150 mg total) by mouth 2 (two) times daily. 270 tablet 3   loratadine (CLARITIN) 10 MG tablet Take 10 mg by mouth daily.     Melatonin 10 MG TABS Take 10 mg by mouth at bedtime.     naproxen sodium (ALEVE) 220 MG tablet Take 220 mg by mouth daily as needed (pain/headace).     rosuvastatin (CRESTOR) 10 MG tablet Take 1 tablet (10 mg total) by mouth daily. 90 tablet 3   Triamcinolone Acetonide (NASACORT ALLERGY 24HR NA) Place 1 spray into both nostrils daily.     VITAMIN D PO Take 1,000 Units by mouth daily.     No current facility-administered medications on file prior to visit.     Allergies:   Other   Social History   Socioeconomic History   Marital status: Married    Spouse name: [name]   Number of  children: 0   Years of education: college   Highest education level: Not on file  Occupational History    Employer: [employer]  Tobacco Use   Smoking status: Never    Passive exposure: Never   Smokeless tobacco: Never  Vaping Use   Vaping status: Never Used  Substance and Sexual Activity   Alcohol use: Yes    Alcohol/week: 1.0 standard drink of alcohol    Types: 1 Glasses of wine per week    Comment: socially on weekends   Drug use: No   Sexual activity: Not Currently    Partners: Male    Birth control/protection: Other-see comments, Post-menopausal    Comment: VASECTOMY-1st intercourse 62 yo-Fewer than 5 partners  Other Topics Concern   Not on file  Social History Narrative   Patient  Lives at home with her husband [name]) [name]    Patient works full time at [employer]   Right handed   Caffeine two cups daily    Social Determinants of Corporate investment banker Strain: Not on file  Food Insecurity: Not on file  Transportation Needs: Not on file  Physical Activity: Not on file  Stress: Not on file  Social Connections: Unknown (06/14/2021)   Received from [facility], [facility]   Social Network    Social Network: Not on file     Family History: The patient's family history includes Alzheimer's disease in her mother; Coronary artery disease in her paternal uncle; Heart attack (age of onset: 67) in her father; Prostate cancer in her father; Stroke (age of onset: 6) in her father.  ROS:   Please see the history of present illness.     All other systems reviewed and are negative.  EKGs/Labs/Other Studies Reviewed:    The following studies were reviewed today:   EKG:  EKG is  ordered today.  The ekg ordered today demonstrates  NSR, LBBB  Recent Labs: 10/17/2022: ALT 28; BUN 21; Creatinine, Ser 1.06; Hemoglobin 15.2; Platelets 201; Potassium 4.2; Sodium 144; TSH 1.180  Recent Lipid Panel    Component Value Date/Time   CHOL 182 04/29/2021 0944   CHOL 200 (H) 05/23/2014 0732   TRIG 360.0 (H) 04/29/2021 0944   TRIG 169 (H) 05/23/2014 0732   HDL 58.20 04/29/2021 0944   HDL 63 05/23/2014 0732   CHOLHDL 3 04/29/2021 0944   VLDL 72.0 (H) 04/29/2021 0944   LDLCALC 103 (H) 05/23/2014 0732   LDLDIRECT 91.0 04/29/2021 0944     Risk Assessment/Calculations:           Physical Exam:    VS:  Vitals:   12/27/22 1435  BP: 138/82  Pulse: 99  SpO2: 98%     Wt Readings from Last 3 Encounters:  12/27/22 177 lb (80.3 kg)  11/11/22 177 lb (80.3 kg)  10/17/22 179 lb 8 oz (81.4 kg)     GEN:  Well nourished, well developed in no acute distress HEENT: Normal NECK: No JVD LYMPHATICS: No lymphadenopathy CARDIAC: RRR, no murmurs, rubs, gallops RESPIRATORY:  Clear to auscultation without rales, wheezing or rhonchi  ABDOMEN: Soft, non-tender, non-distended MUSCULOSKELETAL:  No  edema; No deformity  SKIN: Warm and dry NEUROLOGIC:  Alert and oriented x 3 PSYCHIATRIC:  Normal affect   ASSESSMENT:    Chronic LBBB: She has no known history of coronary disease.  Her echocardiogram showed normal LV function; just abnormal septal motion which is typical for LBBB.  She does not need further workup  for known left bundle Pearlean Sabina block.  We discussed this and I also showed her what her EKG looks like in order to compare in the future   PLAN:    In order of problems listed above:  Follow up 1 year           Medication Adjustments/Labs and Tests Ordered: Current medicines are reviewed at length with the patient today.  Concerns regarding medicines are outlined above.  Orders Placed This Encounter  Procedures   EKG 12-Lead   No orders of the defined types were placed in this encounter.   Patient Instructions  Medication Instructions:  No changes  *If you need a refill on your cardiac medications before your next appointment, please call your pharmacy*   Lab Work: None needed   Testing/Procedures: None needed    Follow-Up: At [facility], you and your health needs are our priority.  As part of our continuing mission to provide you with exceptional heart care, we have created designated Provider Care Teams.  These Care Teams include your primary Cardiologist (physician) and Advanced Practice Providers (APPs -  Physician Assistants and Nurse Practitioners) who all work together to provide you with the care you need, when you need it.    Your next appointment:   1 year(s)  Provider:   Maisie Fus, MD        Signed, Maisie Fus, MD  12/27/2022 2:52 PM    [facility]

## 2022-12-27 NOTE — Patient Instructions (Signed)
Medication Instructions:  No changes  *If you need a refill on your cardiac medications before your next appointment, please call your pharmacy*   Lab Work: None needed   Testing/Procedures: None needed    Follow-Up: At [facility], you and your health needs are our priority.  As part of our continuing mission to provide you with exceptional heart care, we have created designated Provider Care Teams.  These Care Teams include your primary Cardiologist (physician) and Advanced Practice Providers (APPs -  Physician Assistants and Nurse Practitioners) who all work together to provide you with the care you need, when you need it.    Your next appointment:   1 year(s)  Provider:   Maisie Fus, MD

## 2023-01-12 LAB — HM DEXA SCAN

## 2023-01-12 LAB — HM MAMMOGRAPHY

## 2023-01-13 ENCOUNTER — Encounter: Payer: Self-pay | Admitting: Internal Medicine

## 2023-01-13 ENCOUNTER — Other Ambulatory Visit (INDEPENDENT_AMBULATORY_CARE_PROVIDER_SITE_OTHER): Payer: BC Managed Care – PPO

## 2023-01-13 DIAGNOSIS — E7849 Other hyperlipidemia: Secondary | ICD-10-CM

## 2023-01-13 DIAGNOSIS — R739 Hyperglycemia, unspecified: Secondary | ICD-10-CM | POA: Diagnosis not present

## 2023-01-13 LAB — COMPREHENSIVE METABOLIC PANEL
ALT: 20 U/L (ref 0–35)
AST: 20 U/L (ref 0–37)
Albumin: 4.5 g/dL (ref 3.5–5.2)
Alkaline Phosphatase: 64 U/L (ref 39–117)
BUN: 20 mg/dL (ref 6–23)
CO2: 29 meq/L (ref 19–32)
Calcium: 10.2 mg/dL (ref 8.4–10.5)
Chloride: 106 meq/L (ref 96–112)
Creatinine, Ser: 0.97 mg/dL (ref 0.40–1.20)
GFR: 61.88 mL/min (ref >60.00)
Glucose, Bld: 91 mg/dL (ref 70–99)
Potassium: 4.6 meq/L (ref 3.5–5.1)
Sodium: 144 meq/L (ref 135–145)
Total Bilirubin: 0.5 mg/dL (ref 0.2–1.2)
Total Protein: 7.5 g/dL (ref 6.0–8.3)

## 2023-01-13 LAB — LIPID PANEL
Cholesterol: 148 mg/dL (ref 0–200)
HDL: 50 mg/dL (ref >39.00)
LDL Cholesterol: 68 mg/dL (ref 0–99)
NonHDL: 97.9
Total CHOL/HDL Ratio: 3
Triglycerides: 151 mg/dL — ABNORMAL HIGH (ref 0.0–149.0)
VLDL: 30.2 mg/dL (ref 0.0–40.0)

## 2023-01-13 LAB — HEMOGLOBIN A1C: Hgb A1c MFr Bld: 5.8 % (ref 4.6–6.5)

## 2023-01-14 LAB — PTH, INTACT AND CALCIUM
Calcium: 10.6 mg/dL — ABNORMAL HIGH (ref 8.6–10.4)
PTH: 65 pg/mL (ref 16–77)

## 2023-01-15 ENCOUNTER — Encounter: Payer: Self-pay | Admitting: Internal Medicine

## 2023-04-08 ENCOUNTER — Encounter: Payer: Self-pay | Admitting: Neurology

## 2023-04-27 ENCOUNTER — Encounter: Payer: Self-pay | Admitting: Internal Medicine

## 2023-04-28 ENCOUNTER — Ambulatory Visit: Admitting: Podiatry

## 2023-04-28 ENCOUNTER — Encounter: Payer: Self-pay | Admitting: Podiatry

## 2023-04-28 DIAGNOSIS — M216X1 Other acquired deformities of right foot: Secondary | ICD-10-CM

## 2023-04-28 NOTE — Progress Notes (Signed)
 Subjective:  Patient ID: Danielle Gamble, female    DOB: 1960/09/13,  MRN: [id number]  Chief Complaint  Patient presents with   Nail Problem    Nail and callus     63 y.o. female presents with the above complaint.  Patient presents with right plantarflexed fifth metatarsal.  She states that is causing her some discomfort she has not seen anyone as prior to seeing me denies any other acute complaint she wanted to discuss treatment options for it.   Review of Systems: Negative except as noted in the HPI. Denies N/V/F/Ch.  Past Medical History:  Diagnosis Date   Allergic rhinitis    seasonal   Hyperlipidemia    Multinodular goiter (nontoxic)    Osteopenia 10/2018   T score -1.9 FRAX 8.7% / 0.9%   Ovarian cyst    PMH of   Seizure disorder (HCC)    Dr Sandria Manly    Current Outpatient Medications:    aspirin 81 MG tablet, Take 81 mg by mouth daily., Disp: , Rfl:    Calcium Carbonate (CALCIUM 500 PO), Take 1,200 mg by mouth daily. Patient has osteopenia, Disp: , Rfl:    HYDROcodone bit-homatropine (HYCODAN) 5-1.5 MG/5ML syrup, Take 5 mLs by mouth every 8 (eight) hours as needed for cough., Disp: 120 mL, Rfl: 0   lamoTRIgine (LAMICTAL) 100 MG tablet, Take 1.5 tablets (150 mg total) by mouth 2 (two) times daily., Disp: 270 tablet, Rfl: 3   loratadine (CLARITIN) 10 MG tablet, Take 10 mg by mouth daily., Disp: , Rfl:    Melatonin 10 MG TABS, Take 10 mg by mouth at bedtime., Disp: , Rfl:    naproxen sodium (ALEVE) 220 MG tablet, Take 220 mg by mouth daily as needed (pain/headace)., Disp: , Rfl:    rosuvastatin (CRESTOR) 10 MG tablet, Take 1 tablet (10 mg total) by mouth daily., Disp: 90 tablet, Rfl: 3   Triamcinolone Acetonide (NASACORT ALLERGY 24HR NA), Place 1 spray into both nostrils daily., Disp: , Rfl:    VITAMIN D PO, Take 1,000 Units by mouth daily., Disp: , Rfl:   Social History   Tobacco Use  Smoking Status Never   Passive exposure: Never  Smokeless Tobacco Never    Allergies   Allergen Reactions   Other Other (See Comments)    IV medication given at ED 05/2021 turned arm red/purple/red   Objective:  There were no vitals filed for this visit. There is no height or weight on file to calculate BMI. Constitutional Well developed. Well nourished.  Vascular Dorsalis pedis pulses palpable bilaterally. Posterior tibial pulses palpable bilaterally. Capillary refill normal to all digits.  No cyanosis or clubbing noted. Pedal hair growth normal.  Neurologic Normal speech. Oriented to person, place, and time. Epicritic sensation to light touch grossly present bilaterally.  Dermatologic Nails well groomed and normal in appearance. No open wounds. No skin lesions.  Orthopedic: Right plantarflexed fifth metatarsal with submetatarsal 5 porokeratotic lesion.  Painful to touch   Radiographs: None Assessment:  No diagnosis found. Plan:  Patient was evaluated and treated and all questions answered.  Right plantarflexed fifth metatarsal with submetatarsal 5 lesion -All questions and concerns were discussed with the patient in extensive detail given the amount of lesion is present she will benefit from aggressive debridement of it using chisel blade handle the lesion was debride out to healthy dry tissue no complication noted no pinpoint bleeding noted -I discussed floating osteotomy and in the future if it continues to bother her we  can discuss surgical options  No follow-ups on file.

## 2023-06-20 ENCOUNTER — Encounter: Payer: Self-pay | Admitting: Internal Medicine

## 2023-06-20 DIAGNOSIS — R6889 Other general symptoms and signs: Secondary | ICD-10-CM | POA: Insufficient documentation

## 2023-06-20 NOTE — Progress Notes (Signed)
 Subjective:    Patient ID: Danielle Gamble, female    DOB: 1960-08-16, 63 y.o.   MRN: [id number]      HPI Danielle Gamble is here for  Chief Complaint  Patient presents with   Mucus    Mucus that feels like she can't clear her throat   Insomnia    Hard to fall back asleep once she wakes up    Tingling    Tingling in both legs (wants to check for vitamin deficiency)    Mucus in back of throat x several weeks - started using netti pot and that has helped.  She is not getting good sleep -when she lays down she feels like she is choking on the mucus.  Uses cloraseptic spray.   Uses nasacort and claritin daily.     Difficulty sleeping - acute on chronic.  Taking melatonin nightly.  Feels she needs something else to help her sleep.     Tingling in legs - no numbness - not in feet.  Usually lower leg.  No restless feeling.  No back pain.  No muscle cramping.  Wonders about vitamin deficiency   Has been exercising and eating better.   Medications and allergies reviewed with patient and updated if appropriate.  Current Outpatient Medications on File Prior to Visit  Medication Sig Dispense Refill   aspirin 81 MG tablet Take 81 mg by mouth daily.     Calcium  Carbonate (CALCIUM  500 PO) Take 1,200 mg by mouth daily. Patient has osteopenia     lamoTRIgine  (LAMICTAL ) 100 MG tablet Take 1.5 tablets (150 mg total) by mouth 2 (two) times daily. 270 tablet 3   loratadine (CLARITIN) 10 MG tablet Take 10 mg by mouth daily.     Melatonin 10 MG TABS Take 10 mg by mouth at bedtime.     naproxen sodium (ALEVE) 220 MG tablet Take 220 mg by mouth daily as needed (pain/headace).     rosuvastatin  (CRESTOR ) 10 MG tablet Take 1 tablet (10 mg total) by mouth daily. 90 tablet 3   Triamcinolone Acetonide (NASACORT ALLERGY 24HR NA) Place 1 spray into both nostrils daily.     VITAMIN D  PO Take 1,000 Units by mouth daily.     No current facility-administered medications on file prior to visit.    Review of  Systems  Constitutional:  Negative for fever.  HENT:  Positive for postnasal drip. Negative for congestion, sinus pressure, sinus pain, sore throat and trouble swallowing.        Feels like glands are a little swollen - nontender  Gastrointestinal:        No gerd       Objective:   Vitals:   06/21/23 0832  BP: 138/74  Pulse: 86  Temp: 98.3 F (36.8 C)  SpO2: 98%   BP Readings from Last 3 Encounters:  06/21/23 138/74  12/27/22 138/82  11/11/22 136/84   Wt Readings from Last 3 Encounters:  06/21/23 171 lb (77.6 kg)  12/27/22 177 lb (80.3 kg)  11/11/22 177 lb (80.3 kg)   Body mass index is 26 kg/m.    Physical Exam Constitutional:      General: She is not in acute distress.    Appearance: Normal appearance. She is not ill-appearing.  HENT:     Head: Normocephalic and atraumatic.     Right Ear: Tympanic membrane, ear canal and external ear normal.     Left Ear: Tympanic membrane, ear canal and external ear normal.  Mouth/Throat:     Mouth: Mucous membranes are moist.     Pharynx: No oropharyngeal exudate or posterior oropharyngeal erythema.  Eyes:     Conjunctiva/sclera: Conjunctivae normal.  Cardiovascular:     Rate and Rhythm: Normal rate and regular rhythm.  Pulmonary:     Effort: Pulmonary effort is normal. No respiratory distress.     Breath sounds: Normal breath sounds. No wheezing or rales.  Musculoskeletal:     Cervical back: Neck supple. No tenderness.  Lymphadenopathy:     Cervical: No cervical adenopathy.  Skin:    General: Skin is warm and dry.  Neurological:     Mental Status: She is alert.            Assessment & Plan:    See Problem List for Assessment and Plan of chronic medical problems.

## 2023-06-21 ENCOUNTER — Ambulatory Visit (INDEPENDENT_AMBULATORY_CARE_PROVIDER_SITE_OTHER): Payer: Self-pay | Admitting: Internal Medicine

## 2023-06-21 VITALS — BP 138/74 | HR 86 | Temp 98.3°F | Ht 68.0 in | Wt 171.0 lb

## 2023-06-21 DIAGNOSIS — R7303 Prediabetes: Secondary | ICD-10-CM | POA: Diagnosis not present

## 2023-06-21 DIAGNOSIS — R6889 Other general symptoms and signs: Secondary | ICD-10-CM | POA: Diagnosis not present

## 2023-06-21 DIAGNOSIS — G479 Sleep disorder, unspecified: Secondary | ICD-10-CM

## 2023-06-21 DIAGNOSIS — E7849 Other hyperlipidemia: Secondary | ICD-10-CM

## 2023-06-21 MED ORDER — AMOXICILLIN-POT CLAVULANATE 875-125 MG PO TABS: 1.0000 | ORAL_TABLET | Freq: Two times a day (BID) | ORAL | 0 refills | Status: AC

## 2023-06-21 MED ORDER — TRAZODONE HCL 50 MG PO TABS: 50.0000 mg | ORAL_TABLET | Freq: Every day | ORAL | 5 refills | Status: DC

## 2023-06-21 NOTE — Patient Instructions (Addendum)
    Blood work ordered - have this done when you want.   Medications changes include :   Augmentin twice daily, trazodone .  Start a B complex vitamin    Return for October CPE.

## 2023-06-21 NOTE — Assessment & Plan Note (Signed)
 Subacute Going on for a while - not improving despite maximum treatment with otc meds Concern for possible indwelling bacterial infection Start Augmentin bid x 5 days

## 2023-06-21 NOTE — Assessment & Plan Note (Signed)
 Chronic Continue regular exercise and healthy diet  Check lipid panel, CMP Continue rosuvastatin  10 mg daily

## 2023-06-21 NOTE — Assessment & Plan Note (Signed)
 Chronic Taking melatonin 10 mg nightly Start Trazodone  50 mg nightly

## 2023-06-21 NOTE — Assessment & Plan Note (Signed)
 Chronic Lab Results  Component Value Date   HGBA1C 5.8 01/13/2023   Check a1c Low sugar / carb diet Stressed regular exercise

## 2023-06-28 ENCOUNTER — Ambulatory Visit (INDEPENDENT_AMBULATORY_CARE_PROVIDER_SITE_OTHER): Payer: Self-pay | Admitting: Nurse Practitioner

## 2023-06-28 ENCOUNTER — Encounter: Payer: Self-pay | Admitting: Nurse Practitioner

## 2023-06-28 ENCOUNTER — Encounter: Payer: Self-pay | Admitting: Internal Medicine

## 2023-06-28 VITALS — BP 118/82 | HR 85 | Ht 68.25 in | Wt 172.0 lb

## 2023-06-28 DIAGNOSIS — M8589 Other specified disorders of bone density and structure, multiple sites: Secondary | ICD-10-CM | POA: Diagnosis not present

## 2023-06-28 DIAGNOSIS — Z01419 Encounter for gynecological examination (general) (routine) without abnormal findings: Secondary | ICD-10-CM

## 2023-06-28 DIAGNOSIS — Z1331 Encounter for screening for depression: Secondary | ICD-10-CM | POA: Diagnosis not present

## 2023-06-28 DIAGNOSIS — Z780 Asymptomatic menopausal state: Secondary | ICD-10-CM | POA: Diagnosis not present

## 2023-06-28 NOTE — Progress Notes (Signed)
   Danielle Gamble September 11, 1960 [id number]   History:  63 y.o. G0 presents for annual exam. Postmenopausal - no HRT, no bleeding. Normal pap history. H/O right ovarian cystectomy in 44s. Seizure disorder managed by neurology, HLD managed by PCP.   Gynecologic History No LMP recorded. Patient is postmenopausal.   Contraception/Family planning: post menopausal status Sexually active: Yes  Health Maintenance Last Pap: 06/23/2022. Results were: Normal neg HPV Last mammogram: 01/12/2023. Results were: Normal Last colonoscopy: 07/19/2021. Results were: Polyp, 5-year recall Last Dexa: 01/12/2023. Results were: T-score-2.1, FRAX 20% / 1.6%  Past medical history, past surgical history, family history and social history were all reviewed and documented in the EPIC chart. Married. Retired Runner, broadcasting/film/video. Loves to travel.   ROS:  A ROS was performed and pertinent positives and negatives are included.  Exam:  Vitals:   06/28/23 1149  BP: 118/82  Pulse: 85  SpO2: 95%  Weight: 172 lb (78 kg)  Height: 5' 8.25" (1.734 m)     Body mass index is 25.96 kg/m.  General appearance:  Normal Thyroid :  Symmetrical, normal in size, without palpable masses or nodularity. Respiratory  Auscultation:  Clear without wheezing or rhonchi Cardiovascular  Auscultation:  Regular rate, without rubs, murmurs or gallops  Edema/varicosities:  Not grossly evident Abdominal  Soft,nontender, without masses, guarding or rebound.  Liver/spleen:  No organomegaly noted  Hernia:  None appreciated  Skin  Inspection:  Grossly normal Breasts: Examined lying and sitting.   Right: Without masses, retractions, nipple discharge or axillary adenopathy.   Left: Without masses, retractions, nipple discharge or axillary adenopathy. Pelvic: External genitalia:  no lesions              Urethra:  normal appearing urethra with no masses, tenderness or lesions              Bartholins and Skenes: normal                 Vagina: normal  appearing vagina with normal color and discharge, no lesions              Cervix: no lesions Bimanual Exam:  Uterus:  no masses or tenderness              Adnexa: no mass, fullness, tenderness              Rectovaginal: Deferred              Anus:  normal, no lesions  Doria Garden, NP student present as chaperone.   Assessment/Plan:  63 y.o. G0 for annual exam.   Well female exam with routine gynecological exam - Education provided on SBEs, importance of preventative screenings, current guidelines, high calcium  diet, regular exercise, and multivitamin daily.  Labs with PCP.   Postmenopausal - no HRT, no bleeding  Osteopenia of multiple sites - 01/2023 T-score -2.1. Continue Vitamin D  + Calcium  supplement and increase exercise.   Screening for cervical cancer - Normal pap history. Will repeat at 5-year interval per guidelines.   Screening for breast cancer - Normal mammogram history.  Continue annual screenings.  Normal breast exam today.  Screening for colon cancer - 2023 colonoscopy. Will repeat at 5-year interval per GI's recommendation.   Return in about 1 year (around 06/27/2024) for Annual.    Andee Bamberger DNP, 12:18 PM 06/28/2023

## 2023-07-13 ENCOUNTER — Other Ambulatory Visit: Payer: Self-pay | Admitting: Internal Medicine

## 2023-07-18 ENCOUNTER — Encounter: Payer: Self-pay | Admitting: Nurse Practitioner

## 2023-10-04 ENCOUNTER — Other Ambulatory Visit: Payer: Self-pay | Admitting: Neurology

## 2023-10-17 ENCOUNTER — Encounter: Payer: Self-pay | Admitting: Neurology

## 2023-10-17 ENCOUNTER — Ambulatory Visit (INDEPENDENT_AMBULATORY_CARE_PROVIDER_SITE_OTHER): Payer: BC Managed Care – PPO | Admitting: Neurology

## 2023-10-17 VITALS — BP 136/80 | Ht 68.0 in | Wt 167.0 lb

## 2023-10-17 DIAGNOSIS — G40301 Generalized idiopathic epilepsy and epileptic syndromes, not intractable, with status epilepticus: Secondary | ICD-10-CM

## 2023-10-17 MED ORDER — LAMOTRIGINE 100 MG PO TABS: 150.0000 mg | ORAL_TABLET | Freq: Two times a day (BID) | ORAL | 4 refills | Status: AC

## 2023-10-17 MED ORDER — NAYZILAM 5 MG/0.1ML NA SOLN: 5.0000 mg | NASAL | 1 refills | Status: AC

## 2023-10-17 NOTE — Progress Notes (Signed)
 Chief Complaint  Patient presents with   Follow-up    Rm 16, alone, sz f/u, medication compliant, no sz, denies SI/Hi   ASSESSMENT AND PLAN  Danielle Gamble is a 63 y.o. female   Epilepsy   Most recent seizure was on October 13, 2022  EEG was normal   Continue Lamictal  150 mg twice daily  Check labs today   Nayzilam  5 mg rescue inhaler for breakthrough seizure  PCP willing to refill medication according to patient, call for any seizure activity, would be happy to see the patient back if needed   Meds ordered this encounter  Medications   Midazolam  (NAYZILAM ) 5 MG/0.1ML SOLN    Sig: Place 5 mg into the nose as directed. 1 spray into the nostril for seizure, may repeat 10 minutes later if needed    Dispense:  2 each    Refill:  1    Please dispense # 2 boxes   lamoTRIgine  (LAMICTAL ) 100 MG tablet    Sig: Take 1.5 tablets (150 mg total) by mouth 2 (two) times daily.    Dispense:  270 tablet    Refill:  4    Please place refills on file.   DIAGNOSTIC DATA (LABS, IMAGING, TESTING) - I reviewed patient records, labs, notes, testing and imaging myself where available. Reviewed emergency health service record [place], October 13, 2022, witnessed seizure approximately 3 minutes with postictal lasting 20 minutes,  MEDICAL HISTORY:  Danielle Gamble is a 63 years old right-handed Caucasian female, previously patient of Dr. Maurice,  I saw her initially on March 17th 2015.   She had a history of seizure, first one was in 1995, no warning signs, generalized tonic-clonic seizure, with tongue biting, incontinence, a second seizure was in 1997, generalized, mild body shaking,   MRI of the brain in 1997 was normal, EEG was normal,   She was initially treated with Dilantin, has been on current dose of lamotrigine  since 2011, 100 mg tablet, half tablet in the morning, 1 tablet every night, no side effect, doing very well, still driving, work full time as a Radio producer,    UPDATE Jan 18 2017: She had a seizure in August 2018, lamotrigine  dose was increased to 100 mg twice a day, laboratory evaluations in August 2018, lamotrigine  level 7.5, normal CBC, CMP showed mild elevated calcium  10.4.   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  Last visit Lamictal  increased 150 mg twice a day.  Lamictal  level 5.7, TSH 1.180. No more seizures, tolerating higher dose well. She is retired. Mentioned PCP willing to  refill medications. Plans to travel in future.   PHYSICAL EXAM:   Vitals:   10/17/23 1324  BP: 136/80  Weight: 167 lb (75.8 kg)  Height: 5' 8 (1.727 m)   Physical Exam  General: The patient is alert and cooperative at the time of the examination.  Skin: No significant peripheral edema is noted.  Neurologic Exam  Mental status: The patient is alert and oriented x 3 at the time of the examination. The patient has apparent normal recent and remote memory, with an apparently normal attention span and concentration ability.  Cranial nerves: Facial symmetry is present. Speech is normal, no aphasia or dysarthria is  noted. Extraocular movements are full. Visual fields are full.  Motor: The patient has good strength in all 4 extremities.  Sensory examination: Soft touch sensation is symmetric on the face, arms, and legs.  Coordination: The patient has good finger-nose-finger and heel-to-shin bilaterally.  Gait and station: The patient has a normal gait.  REVIEW OF SYSTEMS:  Full 14 system review of systems performed and notable only for as above All other review of systems were negative.   ALLERGIES: Allergies  Allergen Reactions   Other Other (See Comments)    IV medication given at ED 05/2021 turned arm red/purple/red    HOME MEDICATIONS: Current Outpatient Medications  Medication Sig Dispense Refill   aspirin 81 MG tablet Take 81 mg by mouth daily.     Calcium  Carbonate (CALCIUM  500 PO) Take 1,200 mg by mouth daily. Patient has osteopenia     lamoTRIgine  (LAMICTAL ) 100 MG tablet TAKE 1.5 TABLETS BY MOUTH 2 TIMES DAILY. 270 tablet 3   loratadine (CLARITIN) 10 MG tablet Take 10 mg by mouth daily.     Melatonin 10 MG TABS Take 10 mg by mouth at bedtime.     naproxen sodium (ALEVE) 220 MG tablet Take 220 mg by mouth daily as needed (pain/headace).     rosuvastatin  (CRESTOR ) 10 MG tablet Take 1 tablet (10 mg total) by mouth daily. 90 tablet 3   traZODone  (DESYREL ) 50 MG tablet TAKE 1 TABLET BY MOUTH EVERYDAY AT BEDTIME 90 tablet 2   Triamcinolone Acetonide (NASACORT ALLERGY 24HR NA) Place 1 spray into both nostrils daily.     VITAMIN D  PO Take 1,000 Units by mouth daily.     No current facility-administered medications for this visit.    PAST MEDICAL HISTORY: Past Medical History:  Diagnosis Date   Allergic rhinitis    seasonal   Hyperlipidemia    Multinodular goiter (nontoxic)    Osteopenia 10/2018   T score -1.9 FRAX 8.7% / 0.9%   Ovarian cyst    PMH of   Seizure disorder (HCC)    Dr Maurice    PAST SURGICAL HISTORY: Past Surgical History:  Procedure Laterality Date    ABDOMINAL SURGERY     Expl. Laparotomy-Right ovar.cyst   BIOPSY THYROID   2015   Dr Trixie   COLONOSCOPY  2012   negative, F/U in 2022([facility])   OVARIAN CYST SURGERY     done in [facility]   Sternal Cystectomy      FAMILY HISTORY: Family History  Problem Relation Age of Onset   Alzheimer's disease Mother    Prostate cancer Father    Heart attack Father 80       CBAG; pacer   Stroke Father 57   Coronary artery disease Paternal Uncle     SOCIAL HISTORY: Social History   Socioeconomic History   Marital status: Married  Spouse name: Elsie   Number of children: 0   Years of education: college   Highest education level: Master's degree (e.g., MA, MS, MEng, MEd, MSW, MBA)  Occupational History    Employer: [employer]  Tobacco Use   Smoking status: Never    Passive exposure: Never   Smokeless tobacco: Never  Vaping Use   Vaping status: Never Used  Substance and Sexual Activity   Alcohol use: Yes    Alcohol/week: 1.0 standard drink of alcohol    Types: 1 Glasses of wine per week    Comment: socially on weekends   Drug use: No   Sexual activity: Not Currently    Partners: Male    Birth control/protection: Other-see comments, Post-menopausal    Comment: VASECTOMY-1st intercourse 63 yo-Fewer than 5 partners  Other Topics Concern   Not on file  Social History Narrative   Patient  Lives at home with her husband Thermon) [name]    Patient works full time at [employer]   Right handed   Caffeine two cups daily   Social Drivers of Corporate investment banker Strain: Low Risk  (06/18/2023)   Overall Financial Resource Strain (CARDIA)    Difficulty of Paying Living Expenses: Not hard at all  Food Insecurity: No Food Insecurity (06/18/2023)   Hunger Vital Sign    Worried About Running Out of Food in the Last Year: Never true    Ran Out of Food in the Last Year: Never true  Transportation Needs: No Transportation Needs (06/18/2023)    PRAPARE - Administrator, Civil Service (Medical): No    Lack of Transportation (Non-Medical): No  Physical Activity: Sufficiently Active (06/18/2023)   Exercise Vital Sign    Days of Exercise per Week: 4 days    Minutes of Exercise per Session: 40 min  Stress: No Stress Concern Present (06/18/2023)   [employer] of Occupational Health - Occupational Stress Questionnaire    Feeling of Stress : Not at all  Social Connections: Moderately Isolated (06/18/2023)   Social Connection and Isolation Panel    Frequency of Communication with Friends and Family: Three times a week    Frequency of Social Gatherings with Friends and Family: More than three times a week    Attends Religious Services: Never    Database administrator or Organizations: No    Attends Engineer, structural: Not on file    Marital Status: Married  Intimate Partner Violence: Unknown (05/06/2021)   Received from [facility]   HITS    Physically Hurt: Not on file    Insult or Talk Down To: Not on file    Threaten Physical Harm: Not on file    Scream or Curse: Not on file   Lauraine Gayland MANDES, DNP  [facility] [address] [place], [place] [address] [phone]

## 2023-10-17 NOTE — Patient Instructions (Signed)
 Check labs today  Continue Lamictal   Nayzilam  5 mg at onset of seizure  Meds ordered this encounter  Medications   Midazolam  (NAYZILAM ) 5 MG/0.1ML SOLN    Sig: Place 5 mg into the nose as directed. 1 spray into the nostril for seizure, may repeat 10 minutes later if needed    Dispense:  2 each    Refill:  1    Please dispense # 2 boxes   lamoTRIgine  (LAMICTAL ) 100 MG tablet    Sig: Take 1.5 tablets (150 mg total) by mouth 2 (two) times daily.    Dispense:  270 tablet    Refill:  4    Please place refills on file.

## 2023-10-18 ENCOUNTER — Ambulatory Visit: Payer: Self-pay | Admitting: Neurology

## 2023-10-18 ENCOUNTER — Encounter: Payer: Self-pay | Admitting: Internal Medicine

## 2023-10-18 LAB — COMPREHENSIVE METABOLIC PANEL WITH GFR
ALT: 26 IU/L (ref 0–32)
AST: 31 IU/L (ref 0–40)
Albumin: 4.4 g/dL (ref 3.9–4.9)
Alkaline Phosphatase: 105 IU/L (ref 49–135)
BUN/Creatinine Ratio: 19 (ref 12–28)
BUN: 21 mg/dL (ref 8–27)
Bilirubin Total: 0.3 mg/dL (ref 0.0–1.2)
CO2: 25 mmol/L (ref 20–29)
Calcium: 10.8 mg/dL — ABNORMAL HIGH (ref 8.7–10.3)
Chloride: 104 mmol/L (ref 96–106)
Creatinine, Ser: 1.08 mg/dL — ABNORMAL HIGH (ref 0.57–1.00)
Globulin, Total: 2.6 g/dL (ref 1.5–4.5)
Glucose: 104 mg/dL — ABNORMAL HIGH (ref 70–99)
Potassium: 4.1 mmol/L (ref 3.5–5.2)
Sodium: 143 mmol/L (ref 134–144)
Total Protein: 7 g/dL (ref 6.0–8.5)
eGFR: 57 mL/min/1.73 — ABNORMAL LOW (ref >59)

## 2023-10-18 LAB — CBC WITH DIFFERENTIAL/PLATELET
Basophils Absolute: 0 x10E3/uL (ref 0.0–0.2)
Basos: 0 %
EOS (ABSOLUTE): 0.1 x10E3/uL (ref 0.0–0.4)
Eos: 2 %
Hematocrit: 45.2 % (ref 34.0–46.6)
Hemoglobin: 14.4 g/dL (ref 11.1–15.9)
Immature Grans (Abs): 0 x10E3/uL (ref 0.0–0.1)
Immature Granulocytes: 0 %
Lymphocytes Absolute: 2.1 x10E3/uL (ref 0.7–3.1)
Lymphs: 31 %
MCH: 31.1 pg (ref 26.6–33.0)
MCHC: 31.9 g/dL (ref 31.5–35.7)
MCV: 98 fL — ABNORMAL HIGH (ref 79–97)
Monocytes Absolute: 0.3 x10E3/uL (ref 0.1–0.9)
Monocytes: 5 %
Neutrophils Absolute: 4.2 x10E3/uL (ref 1.4–7.0)
Neutrophils: 62 %
Platelets: 211 x10E3/uL (ref 150–450)
RBC: 4.63 x10E6/uL (ref 3.77–5.28)
RDW: 12.2 % (ref 11.7–15.4)
WBC: 6.8 x10E3/uL (ref 3.4–10.8)

## 2023-10-18 LAB — LAMOTRIGINE LEVEL: Lamotrigine Lvl: 9.6 ug/mL (ref 2.0–20.0)

## 2023-10-27 IMAGING — DX DG CHEST 1V PORT
1 series · 1 of 1 positions shown · non-contrast
Comparison: None Available.

CLINICAL DATA: Dizziness

EXAM:
PORTABLE CHEST 1 VIEW

[chest ap]
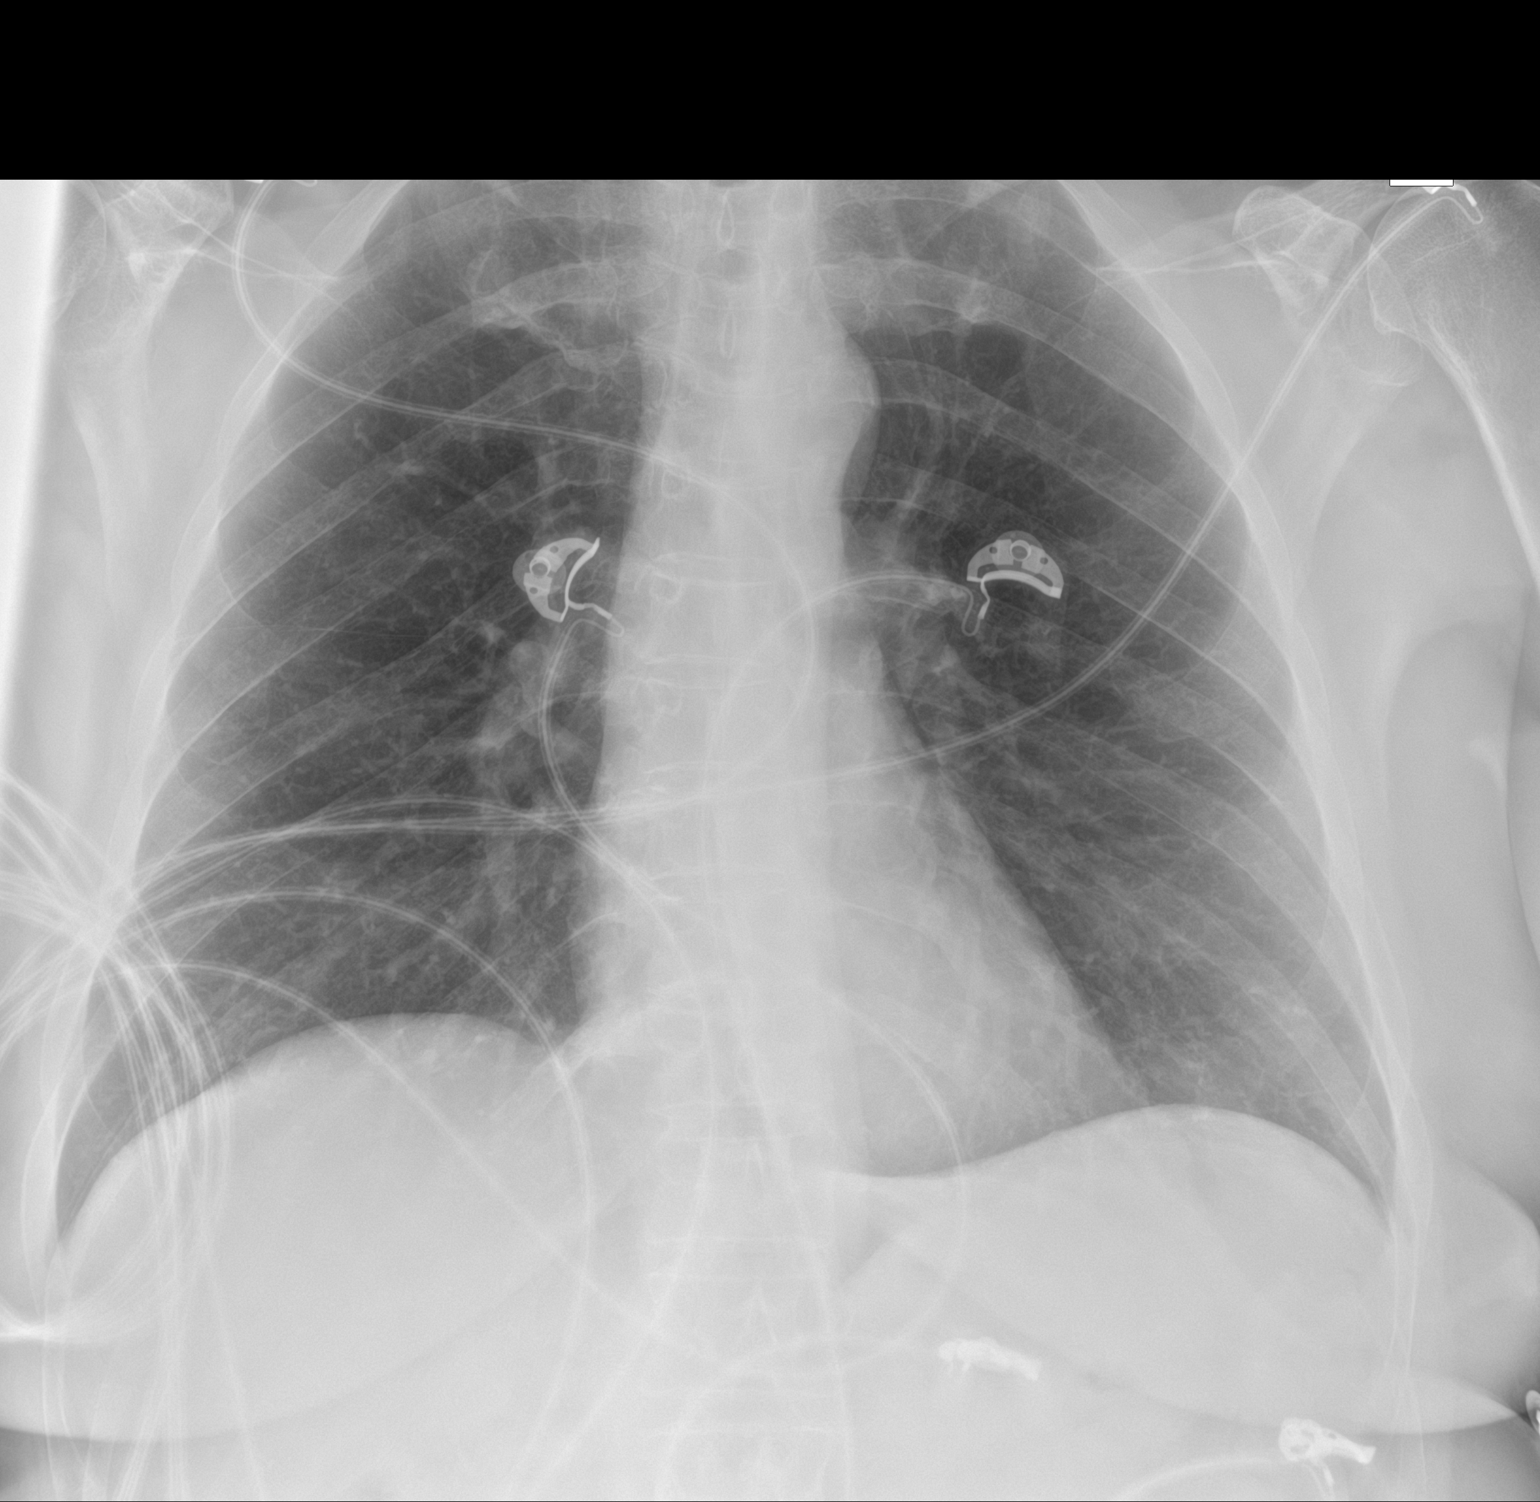

[1 of 1 positions shown; findings below may reference images not displayed]

FINDINGS: The heart size and mediastinal contours are within normal limits.
Both lungs are clear. The visualized skeletal structures are
unremarkable.
IMPRESSION: No active disease.

## 2023-10-28 IMAGING — CT CT ANGIO CHEST
2 of 6 series · 19 of 36 positions shown · IV contrast (agent unspecified)
Comparison: None Available.

CLINICAL DATA: Pulmonary embolism (PE) suspected, positive D-dimer.
Hypertension

EXAM:
CT ANGIOGRAPHY CHEST WITH CONTRAST
TECHNIQUE: Multidetector CT imaging of the chest was performed using the
standard protocol during bolus administration of intravenous
contrast. Multiplanar CT image reconstructions and MIPs were
obtained to evaluate the vascular anatomy.

[Series 7: pe thins · axial · 0.69mm/px · z∈[+1170,+1392]mm · 18 of 353 slices shown]
[im 18/353  lung]
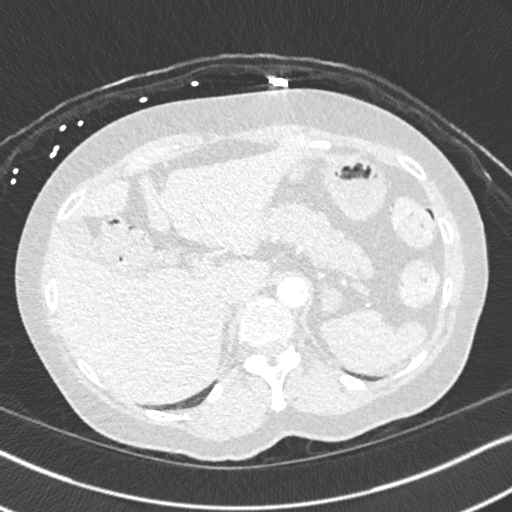
[im 36/353  mediastinal]
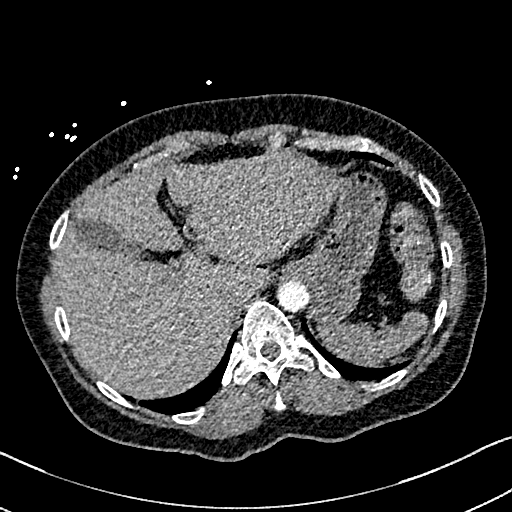
[im 53/353  lung]
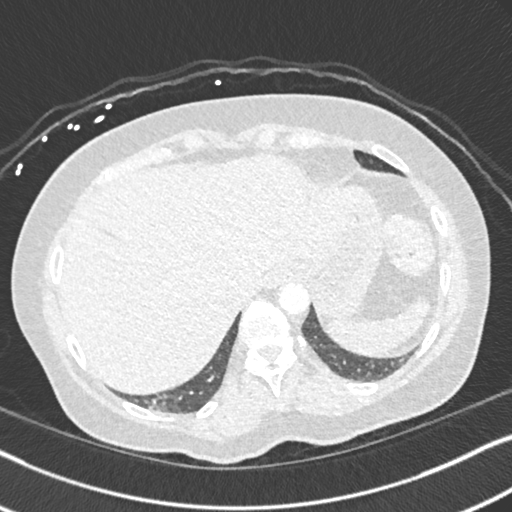
[im 71/353  mediastinal]
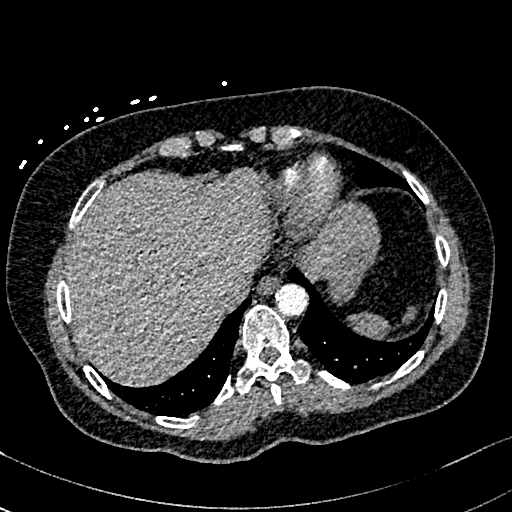
[im 89/353  lung]
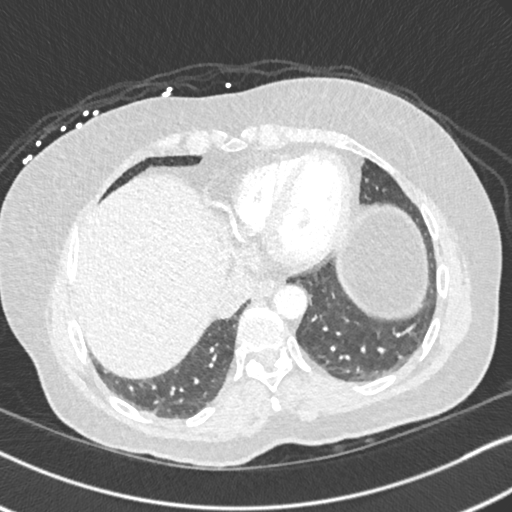
[im 106/353  mediastinal]
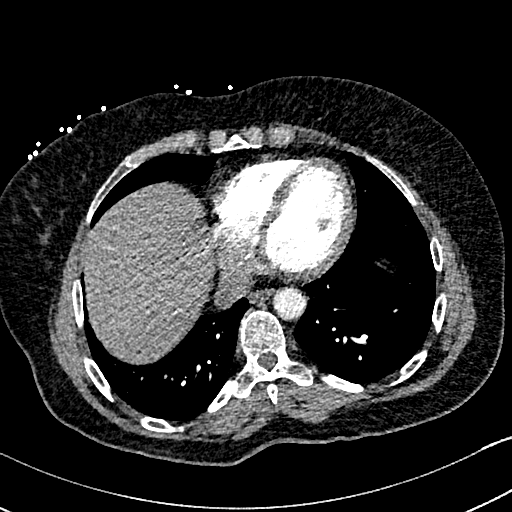
[im 124/353  lung]
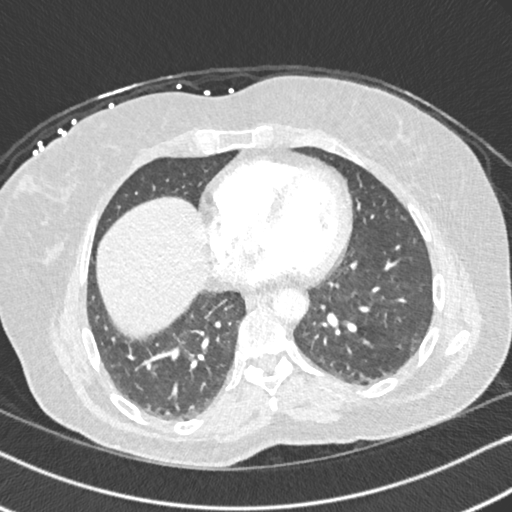
[im 141/353  mediastinal]
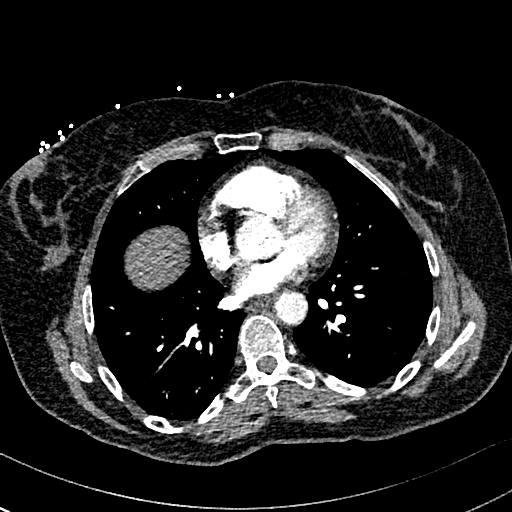
[im 159/353  lung]
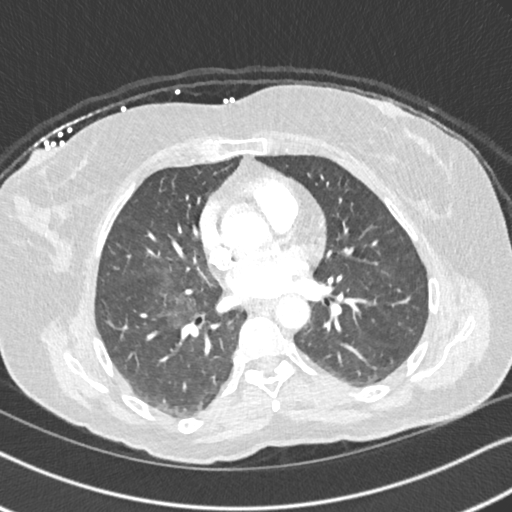
[im 194/353  mediastinal]
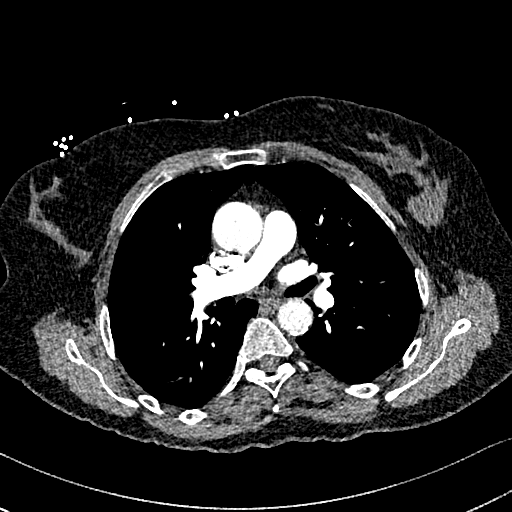
[im 212/353  lung]
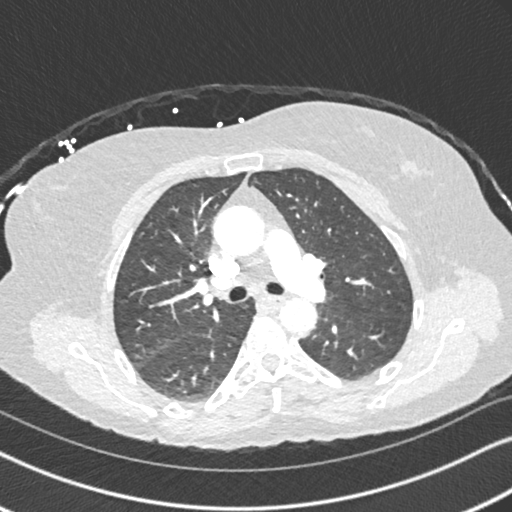
[im 229/353  mediastinal]
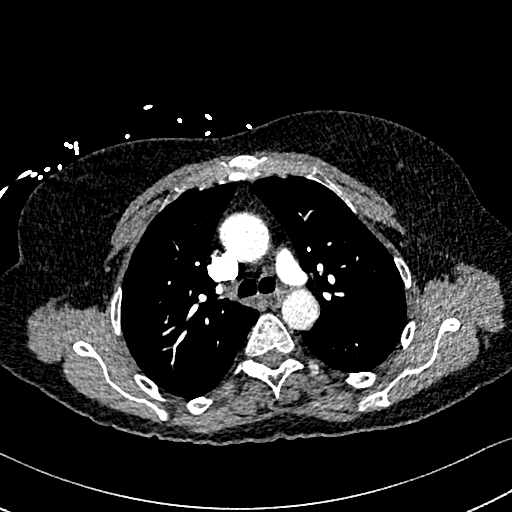
[im 247/353  lung]
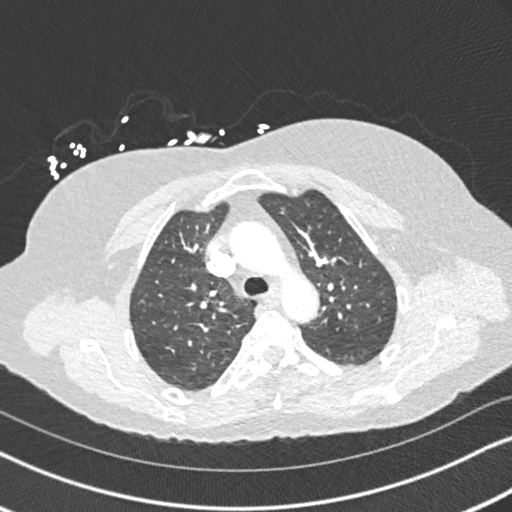
[im 265/353  mediastinal]
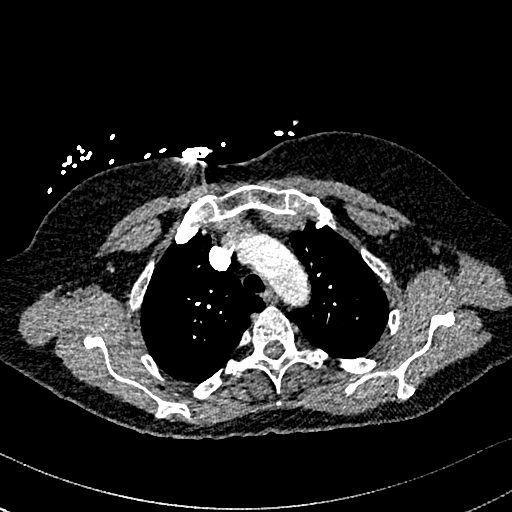
[im 282/353  lung]
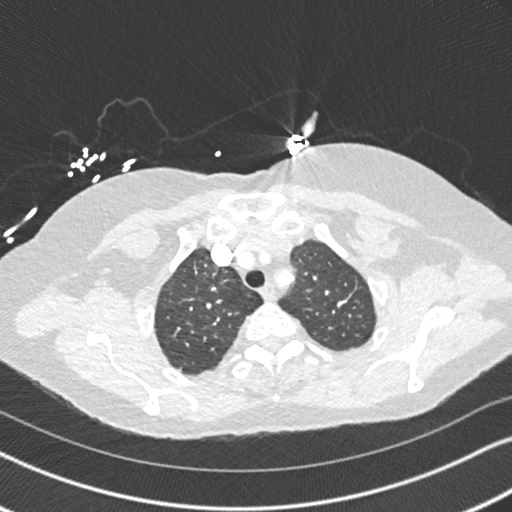
[im 300/353  mediastinal]
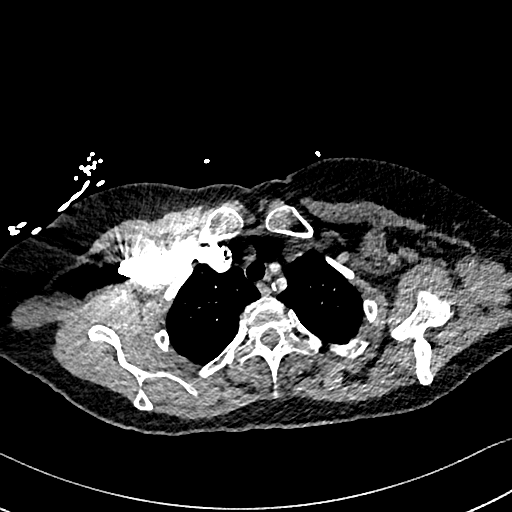
[im 317/353  lung]
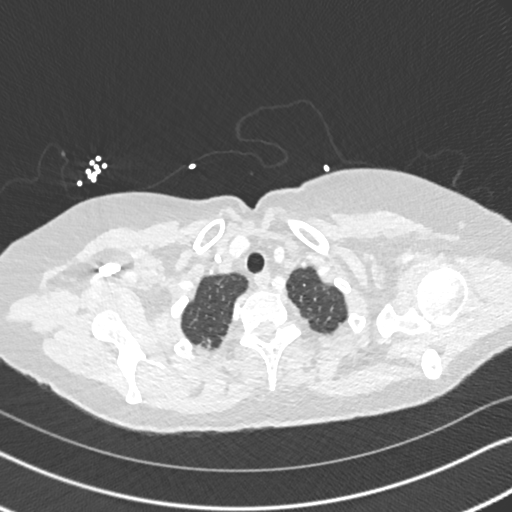
[im 335/353  mediastinal]
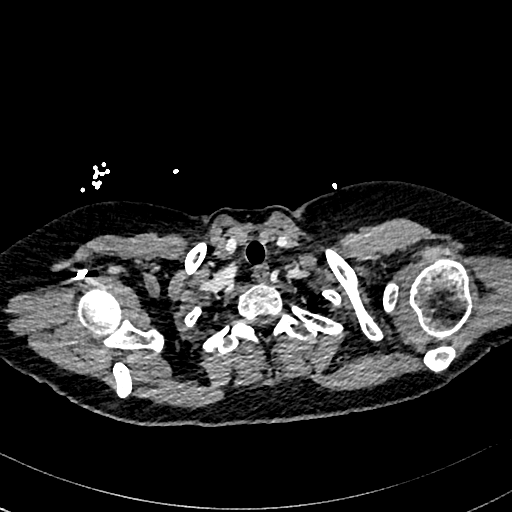

[Series 8: pe 2mm cor · coronal · 0.51mm/px · 1 of 136 slices shown]
[im 68/136  mediastinal]
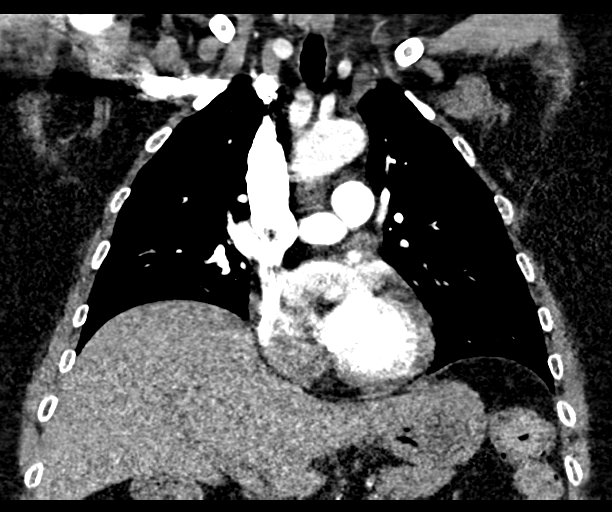

[19 of 36 positions shown; findings below may reference images not displayed]

RADIATION DOSE REDUCTION: This exam was performed according to the
departmental dose-optimization program which includes automated
exposure control, adjustment of the mA and/or kV according to
patient size and/or use of iterative reconstruction technique.

CONTRAST:  50mL OMNIPAQUE IOHEXOL 350 MG/ML SOLN
FINDINGS: Cardiovascular: Adequate opacification of the pulmonary arterial
tree. No intraluminal filling defect identified to suggest acute
pulmonary embolism. Central pulmonary arteries are of normal
caliber. No significant coronary artery calcification. Cardiac size
within normal limits. No pericardial effusion. Mild atherosclerotic
calcification within the thoracic aorta. No aortic aneurysm.

Mediastinum/Nodes: No enlarged mediastinal, hilar, or axillary lymph
nodes. Thyroid gland, trachea, and esophagus demonstrate no
significant findings.

Lungs/Pleura: Lungs are clear. No pleural effusion or pneumothorax.

Upper Abdomen: No acute abnormality.

Musculoskeletal: No acute bone abnormality. No lytic or blastic bone
lesion.

Review of the MIP images confirms the above findings.
IMPRESSION: No pulmonary embolism.  No acute intrathoracic pathology identified.

Aortic Atherosclerosis (3M5CU-BS4.4).

## 2023-11-15 ENCOUNTER — Encounter: Admitting: Internal Medicine

## 2024-01-15 LAB — HM MAMMOGRAPHY

## 2024-01-18 ENCOUNTER — Encounter: Payer: Self-pay | Admitting: Internal Medicine

## 2024-01-18 DIAGNOSIS — M79676 Pain in unspecified toe(s): Secondary | ICD-10-CM

## 2024-01-19 ENCOUNTER — Encounter: Payer: Self-pay | Admitting: Internal Medicine

## 2024-01-31 ENCOUNTER — Other Ambulatory Visit: Payer: Self-pay | Admitting: Internal Medicine

## 2024-02-04 ENCOUNTER — Encounter: Payer: Self-pay | Admitting: Internal Medicine

## 2024-02-07 ENCOUNTER — Ambulatory Visit: Admitting: Podiatry

## 2024-02-07 ENCOUNTER — Encounter: Payer: Self-pay | Admitting: Podiatry

## 2024-02-07 ENCOUNTER — Encounter: Payer: Self-pay | Admitting: Internal Medicine

## 2024-02-07 DIAGNOSIS — M85851 Other specified disorders of bone density and structure, right thigh: Secondary | ICD-10-CM

## 2024-02-07 DIAGNOSIS — R7303 Prediabetes: Secondary | ICD-10-CM

## 2024-02-07 DIAGNOSIS — E7849 Other hyperlipidemia: Secondary | ICD-10-CM

## 2024-02-07 DIAGNOSIS — L600 Ingrowing nail: Secondary | ICD-10-CM | POA: Diagnosis not present

## 2024-02-07 DIAGNOSIS — E042 Nontoxic multinodular goiter: Secondary | ICD-10-CM

## 2024-02-07 NOTE — Patient Instructions (Signed)

## 2024-02-08 ENCOUNTER — Other Ambulatory Visit

## 2024-02-08 DIAGNOSIS — R7303 Prediabetes: Secondary | ICD-10-CM | POA: Diagnosis not present

## 2024-02-08 DIAGNOSIS — E7849 Other hyperlipidemia: Secondary | ICD-10-CM

## 2024-02-08 DIAGNOSIS — M85851 Other specified disorders of bone density and structure, right thigh: Secondary | ICD-10-CM

## 2024-02-08 DIAGNOSIS — E042 Nontoxic multinodular goiter: Secondary | ICD-10-CM | POA: Diagnosis not present

## 2024-02-08 LAB — COMPREHENSIVE METABOLIC PANEL WITH GFR
ALT: 23 U/L (ref 3–35)
AST: 22 U/L (ref 5–37)
Albumin: 4.4 g/dL (ref 3.5–5.2)
Alkaline Phosphatase: 61 U/L (ref 39–117)
BUN: 23 mg/dL (ref 6–23)
CO2: 28 meq/L (ref 19–32)
Calcium: 9.9 mg/dL (ref 8.4–10.5)
Chloride: 105 meq/L (ref 96–112)
Creatinine, Ser: 1.04 mg/dL (ref 0.40–1.20)
GFR: 56.49 mL/min — ABNORMAL LOW
Glucose, Bld: 95 mg/dL (ref 70–99)
Potassium: 3.8 meq/L (ref 3.5–5.1)
Sodium: 141 meq/L (ref 135–145)
Total Bilirubin: 0.6 mg/dL (ref 0.2–1.2)
Total Protein: 7.2 g/dL (ref 6.0–8.3)

## 2024-02-08 LAB — LIPID PANEL
Cholesterol: 150 mg/dL (ref 28–200)
HDL: 60.9 mg/dL
LDL Cholesterol: 65 mg/dL (ref 10–99)
NonHDL: 88.67
Total CHOL/HDL Ratio: 2
Triglycerides: 120 mg/dL (ref 10.0–149.0)
VLDL: 24 mg/dL (ref 0.0–40.0)

## 2024-02-08 LAB — CBC
HCT: 43.1 % (ref 36.0–46.0)
Hemoglobin: 14.5 g/dL (ref 12.0–15.0)
MCHC: 33.6 g/dL (ref 30.0–36.0)
MCV: 94.5 fl (ref 78.0–100.0)
Platelets: 192 K/uL (ref 150.0–400.0)
RBC: 4.56 Mil/uL (ref 3.87–5.11)
RDW: 12.9 % (ref 11.5–15.5)
WBC: 5.6 K/uL (ref 4.0–10.5)

## 2024-02-08 LAB — VITAMIN D 25 HYDROXY (VIT D DEFICIENCY, FRACTURES): VITD: 33.64 ng/mL (ref 30.00–100.00)

## 2024-02-08 LAB — HEMOGLOBIN A1C: Hgb A1c MFr Bld: 5.6 % (ref 4.6–6.5)

## 2024-02-08 LAB — TSH: TSH: 2.49 u[IU]/mL (ref 0.35–5.50)

## 2024-02-08 NOTE — Progress Notes (Signed)
 Subjective:   Patient ID: Danielle Gamble, female   DOB: 64 y.o.   MRN: [id number]   HPI Patient presents stating she has had chronic pain with her toenails of both feet and she has tried medication topical oral and the pain is quite intense in her nailbeds hallux bilateral   ROS      Objective:  Physical Exam  Neurovascular status intact with thick dystrophic painful hallux nailbeds bilateral chronic in nature failure to respond conservatively     Assessment:  Probability that this is more trauma versus fungus creating the damage to the nailbeds     Plan:  H&P reviewed and I have recommended permanent nail removal and patient is scheduled to have this done today.  I allowed her to read consent form going over all possible complications with this procedure and I educated her on nail removal and at this point I infiltrated each big toe 60 mg Xylocaine Marcaine and then at that point I went ahead and I  did sterile prep and using sterile instrumentation removed the hallux nails exposed matrix applied phenol 5 applications 30 seconds to each bed followed by alcohol lavage sterile dressing and instructed on postoperative soaks and to wear dressings 24 hours take them off earlier if throbbing were to occur and answered all questions

## 2024-02-08 NOTE — Patient Instructions (Addendum)
 "      Medications changes include :   increase trazodone  to 75- 100 mg at hight    A referral was ordered for Dr Onita and someone will call you to schedule an appointment.     Return in about 1 year (around 02/08/2025) for Physical Exam.    Health Maintenance, Female Adopting a healthy lifestyle and getting preventive care are important in promoting health and wellness. Ask your health care provider about: The right schedule for you to have regular tests and exams. Things you can do on your own to prevent diseases and keep yourself healthy. What should I know about diet, weight, and exercise? Eat a healthy diet  Eat a diet that includes plenty of vegetables, fruits, low-fat dairy products, and lean protein. Do not eat a lot of foods that are high in solid fats, added sugars, or sodium. Maintain a healthy weight Body mass index (BMI) is used to identify weight problems. It estimates body fat based on height and weight. Your health care provider can help determine your BMI and help you achieve or maintain a healthy weight. Get regular exercise Get regular exercise. This is one of the most important things you can do for your health. Most adults should: Exercise for at least 150 minutes each week. The exercise should increase your heart rate and make you sweat (moderate-intensity exercise). Do strengthening exercises at least twice a week. This is in addition to the moderate-intensity exercise. Spend less time sitting. Even light physical activity can be beneficial. Watch cholesterol and blood lipids Have your blood tested for lipids and cholesterol at 66 years of age, then have this test every 5 years. Have your cholesterol levels checked more often if: Your lipid or cholesterol levels are high. You are older than 66 years of age. You are at high risk for heart disease. What should I know about cancer screening? Depending on your health history and family history, you may need to  have cancer screening at various ages. This may include screening for: Breast cancer. Cervical cancer. Colorectal cancer. Skin cancer. Lung cancer. What should I know about heart disease, diabetes, and high blood pressure? Blood pressure and heart disease High blood pressure causes heart disease and increases the risk of stroke. This is more likely to develop in people who have high blood pressure readings or are overweight. Have your blood pressure checked: Every 3-5 years if you are 74-1 years of age. Every year if you are 69 years old or older. Diabetes Have regular diabetes screenings. This checks your fasting blood sugar level. Have the screening done: Once every three years after age 77 if you are at a normal weight and have a low risk for diabetes. More often and at a younger age if you are overweight or have a high risk for diabetes. What should I know about preventing infection? Hepatitis B If you have a higher risk for hepatitis B, you should be screened for this virus. Talk with your health care provider to find out if you are at risk for hepatitis B infection. Hepatitis C Testing is recommended for: Everyone born from [birth year] through 1967. Anyone with known risk factors for hepatitis C. Sexually transmitted infections (STIs) Get screened for STIs, including gonorrhea and chlamydia, if: You are sexually active and are younger than 66 years of age. You are older than 66 years of age and your health care provider tells you that you are at risk for this type of infection. Your  sexual activity has changed since you were last screened, and you are at increased risk for chlamydia or gonorrhea. Ask your health care provider if you are at risk. Ask your health care provider about whether you are at high risk for HIV. Your health care provider may recommend a prescription medicine to help prevent HIV infection. If you choose to take medicine to prevent HIV, you should first get tested for  HIV. You should then be tested every 3 months for as long as you are taking the medicine. Pregnancy If you are about to stop having your period (premenopausal) and you may become pregnant, seek counseling before you get pregnant. Take 400 to 800 micrograms (mcg) of folic acid every day if you become pregnant. Ask for birth control (contraception) if you want to prevent pregnancy. Osteoporosis and menopause Osteoporosis is a disease in which the bones lose minerals and strength with aging. This can result in bone fractures. If you are 109 years old or older, or if you are at risk for osteoporosis and fractures, ask your health care provider if you should: Be screened for bone loss. Take a calcium  or vitamin D  supplement to lower your risk of fractures. Be given hormone replacement therapy (HRT) to treat symptoms of menopause. Follow these instructions at home: Alcohol use Do not drink alcohol if: Your health care provider tells you not to drink. You are pregnant, may be pregnant, or are planning to become pregnant. If you drink alcohol: Limit how much you have to: 0-1 drink a day. Know how much alcohol is in your drink. In the [place] one drink equals one 12 oz bottle of beer (355 mL), one 5 oz glass of wine (148 mL), or one 1 oz glass of hard liquor (44 mL). Lifestyle Do not use any products that contain nicotine or tobacco. These products include cigarettes, chewing tobacco, and vaping devices, such as e-cigarettes. If you need help quitting, ask your health care provider. Do not use street drugs. Do not share needles. Ask your health care provider for help if you need support or information about quitting drugs. General instructions Schedule regular health, dental, and eye exams. Stay current with your vaccines. Tell your health care provider if: You often feel depressed. You have ever been abused or do not feel safe at home. Summary Adopting a healthy lifestyle and getting preventive  care are important in promoting health and wellness. Follow your health care provider's instructions about healthy diet, exercising, and getting tested or screened for diseases. Follow your health care provider's instructions on monitoring your cholesterol and blood pressure. This information is not intended to replace advice given to you by your health care provider. Make sure you discuss any questions you have with your health care provider. Document Revised: 06/08/2020 Document Reviewed: 06/08/2020 Elsevier Patient Education  2024 [employer]. "

## 2024-02-08 NOTE — Progress Notes (Signed)
 "   Subjective:    Patient ID: Danielle Gamble, female    DOB: December 24, 1960, 64 y.o.   MRN: [id number]      HPI Danielle Gamble is here for a Physical exam and her chronic medical problems.   She had blood work done previously.  PTH still pending.   Wednesday - both first toe nails removed.  Soaking with epsom salts or vinegar and pain has improved.    Last night had ? Panic attack --- BP 167/88 at 9:15 pm.  She had chills, heart racing.   Episode subsided after about 25 min.  She wonders if this was just a perfect storm of increased stress from different areas-having her toe procedure, moving into a new house, concerned about her blood work and developing diabetes, etc.   Appetite - decreased.   Does not always drink enough water.  These are not new, but something that she has noticed over the past year or so.  She has never drink enough fluids, but is working on that.   Tingling in legs -months-years.  Started in the feet and is always on the feet- occ up to leg.  She often feels this in bed.  This does affect her sleep.  Sleep deprivation from tingling.   Does not feel like a restless feeling.   Medications and allergies reviewed with patient and updated if appropriate.  Medications Ordered Prior to Encounter[1]  Review of Systems  Constitutional:  Negative for fever.  Eyes:  Negative for visual disturbance.  Respiratory:  Negative for cough, shortness of breath and wheezing.   Cardiovascular:  Positive for palpitations (last night only). Negative for chest pain and leg swelling.  Gastrointestinal:  Positive for constipation. Negative for abdominal pain, blood in stool and diarrhea.       No gerd  Genitourinary:  Negative for dysuria and hematuria.  Musculoskeletal:  Negative for arthralgias and back pain.  Skin:  Negative for rash.  Neurological:  Positive for numbness (tingling in feet and lower legs). Negative for light-headedness and headaches.  Psychiatric/Behavioral:   Positive for sleep disturbance. Negative for dysphoric mood. The patient is not nervous/anxious.        Objective:   Vitals:   02/09/24 1330 02/09/24 1422  BP: (!) 140/90 120/76  Pulse: 79   Temp: 98 F (36.7 C)   SpO2: 99%    Filed Weights   02/09/24 1330  Weight: 165 lb (74.8 kg)   Body mass index is 25.09 kg/m.  BP Readings from Last 3 Encounters:  02/09/24 120/76  10/17/23 136/80  06/28/23 118/82    Wt Readings from Last 3 Encounters:  02/09/24 165 lb (74.8 kg)  10/17/23 167 lb (75.8 kg)  06/28/23 172 lb (78 kg)       Physical Exam Constitutional: She appears well-developed and well-nourished. No distress.  HENT:  Head: Normocephalic and atraumatic.  Right Ear: External ear normal. Normal ear canal and TM Left Ear: External ear normal.  Normal ear canal and TM Mouth/Throat: Oropharynx is clear and moist.  Eyes: Conjunctivae normal.  Neck: Neck supple. No tracheal deviation present. No thyromegaly present.  No carotid bruit  Cardiovascular: Normal rate, regular rhythm and normal heart sounds.   No murmur heard.  No edema. Pulmonary/Chest: Effort normal and breath sounds normal. No respiratory distress. She has no wheezes. She has no rales.  Breast: deferred   Abdominal: Soft. She exhibits no distension. There is no tenderness.  Lymphadenopathy: She has no cervical adenopathy.  Skin: Skin is warm and dry. She is not diaphoretic.  Psychiatric: She has a normal mood and affect. Her behavior is normal.     Lab Results  Component Value Date   WBC 5.6 02/08/2024   HGB 14.5 02/08/2024   HCT 43.1 02/08/2024   PLT 192.0 02/08/2024   GLUCOSE 95 02/08/2024   CHOL 150 02/08/2024   TRIG 120.0 02/08/2024   HDL 60.90 02/08/2024   LDLDIRECT 91.0 04/29/2021   LDLCALC 65 02/08/2024   ALT 23 02/08/2024   AST 22 02/08/2024   NA 141 02/08/2024   K 3.8 02/08/2024   CL 105 02/08/2024   CREATININE 1.04 02/08/2024   BUN 23 02/08/2024   CO2 28 02/08/2024   TSH  2.49 02/08/2024   HGBA1C 5.6 02/08/2024         Assessment & Plan:   Physical exam: Screening blood work reviewed Exercise  water aerobics, walking Weight  normal Substance abuse  none   Reviewed recommended immunizations.   Health Maintenance  Topic Date Due   Medicare Annual Wellness (AWV)  Never done   Pneumococcal Vaccine: 50+ Years (1 of 1 - PCV) Never done   DTaP/Tdap/Td (2 - Tdap) 09/02/2019   COVID-19 Vaccine (6 - Mixed Product risk 2025-26 season) 06/23/2024   Colonoscopy  11/18/2024   Bone Density Scan  01/11/2026   Mammogram  01/14/2026   Cervical Cancer Screening (HPV/Pap Cotest)  06/23/2027   Influenza Vaccine  Completed   Hepatitis C Screening  Completed   HIV Screening  Completed   Zoster Vaccines- Shingrix  Completed   Hepatitis B Vaccines 19-59 Average Risk  Aged Out   Meningococcal B Vaccine  Aged Out          See Problem List for Assessment and Plan of chronic medical problems.        [1]  Current Outpatient Medications on File Prior to Visit  Medication Sig Dispense Refill   aspirin 81 MG tablet Take 81 mg by mouth daily.     b complex vitamins capsule Take 1 capsule by mouth daily.     Calcium  Carbonate (CALCIUM  500 PO) Take 1,200 mg by mouth daily. Patient has osteopenia     lamoTRIgine  (LAMICTAL ) 100 MG tablet Take 1.5 tablets (150 mg total) by mouth 2 (two) times daily. 270 tablet 4   loratadine (CLARITIN) 10 MG tablet Take 10 mg by mouth daily.     Melatonin 10 MG TABS Take 10 mg by mouth at bedtime.     Midazolam  (NAYZILAM ) 5 MG/0.1ML SOLN Place 5 mg into the nose as directed. 1 spray into the nostril for seizure, may repeat 10 minutes later if needed 2 each 1   naproxen sodium (ALEVE) 220 MG tablet Take 220 mg by mouth daily as needed (pain/headace).     rosuvastatin  (CRESTOR ) 10 MG tablet TAKE 1 TABLET BY MOUTH EVERY DAY 90 tablet 1   traZODone  (DESYREL ) 50 MG tablet TAKE 1 TABLET BY MOUTH EVERYDAY AT BEDTIME 90 tablet 2    Triamcinolone Acetonide (NASACORT ALLERGY 24HR NA) Place 1 spray into both nostrils daily.     VITAMIN D  PO Take 1,000 Units by mouth daily.     No current facility-administered medications on file prior to visit.   "

## 2024-02-09 ENCOUNTER — Other Ambulatory Visit: Payer: Self-pay

## 2024-02-09 ENCOUNTER — Ambulatory Visit

## 2024-02-09 ENCOUNTER — Ambulatory Visit: Admitting: Internal Medicine

## 2024-02-09 ENCOUNTER — Ambulatory Visit: Payer: Self-pay | Admitting: Internal Medicine

## 2024-02-09 VITALS — BP 120/76 | HR 79 | Temp 98.0°F | Ht 68.0 in | Wt 165.0 lb

## 2024-02-09 DIAGNOSIS — G479 Sleep disorder, unspecified: Secondary | ICD-10-CM | POA: Diagnosis not present

## 2024-02-09 DIAGNOSIS — R7303 Prediabetes: Secondary | ICD-10-CM | POA: Diagnosis not present

## 2024-02-09 DIAGNOSIS — R202 Paresthesia of skin: Secondary | ICD-10-CM

## 2024-02-09 DIAGNOSIS — E538 Deficiency of other specified B group vitamins: Secondary | ICD-10-CM

## 2024-02-09 DIAGNOSIS — E7800 Pure hypercholesterolemia, unspecified: Secondary | ICD-10-CM

## 2024-02-09 DIAGNOSIS — Z85828 Personal history of other malignant neoplasm of skin: Secondary | ICD-10-CM | POA: Insufficient documentation

## 2024-02-09 DIAGNOSIS — M85851 Other specified disorders of bone density and structure, right thigh: Secondary | ICD-10-CM

## 2024-02-09 DIAGNOSIS — Z0000 Encounter for general adult medical examination without abnormal findings: Secondary | ICD-10-CM

## 2024-02-09 DIAGNOSIS — G40309 Generalized idiopathic epilepsy and epileptic syndromes, not intractable, without status epilepticus: Secondary | ICD-10-CM

## 2024-02-09 LAB — VITAMIN B12: Vitamin B-12: 480 pg/mL (ref 211–911)

## 2024-02-09 NOTE — Assessment & Plan Note (Signed)
 Chronic Procedure 1995, last seizure 2024 likely related to stress/fatigue No seizures since then Following with neurology Taking Lamictal  150 mg twice daily

## 2024-02-09 NOTE — Assessment & Plan Note (Signed)
 Chronic She first mention this around 2021 Sounds like it had may have gotten a little worse Taking B complex so doubt B12 deficiency, but will add B12 level to her blood work from yesterday Other blood work normal ?  Side effect from Lamictal  A1c is in normal range so unlikely related to sugars Referral to neurology

## 2024-02-09 NOTE — Assessment & Plan Note (Signed)
 Chronic Lab Results  Component Value Date   HGBA1C 5.6 02/08/2024   Sugars improved Low sugar / carb diet Stressed regular exercise

## 2024-02-09 NOTE — Assessment & Plan Note (Signed)
 Recently had skin cancer removed-basal cell carcinoma from left arm Following closely with dermatology

## 2024-02-09 NOTE — Assessment & Plan Note (Addendum)
 Chronic Taking melatonin 10 mg nightly Currently taking trazodone  50 mg nightly, which is not always effective Increase trazodone  to 75 mg - 100 mg nightly

## 2024-02-09 NOTE — Assessment & Plan Note (Signed)
 Chronic DEXA up-to-date-osteopenia, low FRAX Continue regular exercise, calcium  vitamin D  supplementation Vitamin D  level improved

## 2024-02-09 NOTE — Assessment & Plan Note (Signed)
 Chronic Lab Results  Component Value Date   LDLCALC 65 02/08/2024    Continue regular exercise and healthy diet  Reviewed blood work including lipid panel, CMP, TSH, CBC Continue rosuvastatin  10 mg daily

## 2024-02-09 NOTE — Assessment & Plan Note (Addendum)
 Chronic She decreased her calcium  intake to 1000 mg every other day Most recent calcium  level was normal PTH is pending

## 2024-02-13 ENCOUNTER — Ambulatory Visit: Admitting: Podiatry

## 2024-02-13 ENCOUNTER — Encounter: Payer: Self-pay | Admitting: Podiatry

## 2024-02-13 VITALS — Ht 68.0 in | Wt 165.0 lb

## 2024-02-13 DIAGNOSIS — M7751 Other enthesopathy of right foot: Secondary | ICD-10-CM

## 2024-02-13 NOTE — Progress Notes (Signed)
 Patient presents complaint of pain at the fifth MTP on the right foot.  Has a slight callus there has been getting sore with walking.  Doing fine with her nails where the nail surgeries were done a week ago.   Physical exam:  General appearance: Pleasant, and in no acute distress. AOx3.  Vascular: Pedal pulses: DP 2/4 bilaterally, PT 2 to/4 bilaterally.  Minimal edema lower legs bilaterally. Capillary fill time immediate bilaterally.  Neurological: Grossly intact bilaterally  Dermatologic:   Normal postoperative appearance of nail surgery site hallux bilaterally.  Slight hyperkeratotic buildup on of the fifth MTP right no signs of infection.  Skin normal temperature bilaterally.  Skin normal color, tone, and texture bilaterally.   Musculoskeletal: There is on plantar aspect of the fifth MTP right.  Slight soreness with range of motion of the fifth MTP right.  Tailor's bunion deformity right.  Immediate bilateral    Diagnosis: 1.  Bursitis right foot subfifth MTP.  Plan: -Separate office visit for evaluation and management.  Level 3. - Recommend using topical Voltaren gel over-the-counter on the tender area on the fifth MTP 3-4 times a day.  Wear good comfortable shoe.  If it continues to bother her we can always do a corticosteroid injection.  Debrided some hyperkeratotic tissue from plantar aspect of the foot.   Return as needed

## 2024-02-15 ENCOUNTER — Ambulatory Visit: Payer: Self-pay | Admitting: Internal Medicine

## 2024-02-15 LAB — PTH, INTACT AND CALCIUM
Calcium: 9.9 mg/dL (ref 8.6–10.4)
PTH: 49 pg/mL (ref 16–77)

## 2024-02-27 ENCOUNTER — Encounter: Payer: Self-pay | Admitting: Internal Medicine

## 2024-02-29 ENCOUNTER — Ambulatory Visit: Admitting: Family Medicine

## 2024-03-05 ENCOUNTER — Encounter: Payer: Self-pay | Admitting: Podiatry

## 2024-04-02 ENCOUNTER — Institutional Professional Consult (permissible substitution): Admitting: Neurology

## 2025-02-10 ENCOUNTER — Encounter: Admitting: Internal Medicine
